# Patient Record
Sex: Male | Born: 1965 | Race: White | Hispanic: No | Marital: Married | Smoking: Never smoker
Health system: Southern US, Community
[De-identification: ages and names within clinical notes are randomized; demographics above are authoritative.]

## PROBLEM LIST (undated history)

## (undated) DIAGNOSIS — E119 Type 2 diabetes mellitus without complications: Secondary | ICD-10-CM

## (undated) DIAGNOSIS — J45909 Unspecified asthma, uncomplicated: Secondary | ICD-10-CM

## (undated) DIAGNOSIS — K219 Gastro-esophageal reflux disease without esophagitis: Secondary | ICD-10-CM

## (undated) DIAGNOSIS — Z8719 Personal history of other diseases of the digestive system: Secondary | ICD-10-CM

## (undated) DIAGNOSIS — I10 Essential (primary) hypertension: Secondary | ICD-10-CM

## (undated) DIAGNOSIS — G4730 Sleep apnea, unspecified: Secondary | ICD-10-CM

## (undated) HISTORY — PX: KNEE ARTHROSCOPY: SUR90

## (undated) HISTORY — PX: CHOLECYSTECTOMY: SHX55

## (undated) HISTORY — PX: TONSILLECTOMY: SUR1361

---
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
## 2003-11-08 ENCOUNTER — Ambulatory Visit: Payer: Self-pay | Admitting: Internal Medicine

## 2004-01-23 ENCOUNTER — Ambulatory Visit: Payer: Self-pay | Admitting: Otolaryngology

## 2004-12-02 ENCOUNTER — Ambulatory Visit: Payer: Self-pay | Admitting: Unknown Physician Specialty

## 2006-10-14 ENCOUNTER — Ambulatory Visit: Payer: Self-pay | Admitting: Unknown Physician Specialty

## 2006-10-20 ENCOUNTER — Ambulatory Visit: Payer: Self-pay | Admitting: Unknown Physician Specialty

## 2007-05-20 ENCOUNTER — Ambulatory Visit: Payer: Self-pay | Admitting: Internal Medicine

## 2007-07-26 ENCOUNTER — Encounter: Admission: RE | Admit: 2007-07-26 | Discharge: 2007-07-26 | Payer: Self-pay | Admitting: Orthopedic Surgery

## 2007-08-09 ENCOUNTER — Ambulatory Visit: Payer: Self-pay | Admitting: Internal Medicine

## 2008-01-01 ENCOUNTER — Inpatient Hospital Stay: Payer: Self-pay | Admitting: Internal Medicine

## 2009-10-21 ENCOUNTER — Ambulatory Visit: Payer: Self-pay | Admitting: Internal Medicine

## 2013-01-26 DIAGNOSIS — Z8719 Personal history of other diseases of the digestive system: Secondary | ICD-10-CM

## 2013-01-26 HISTORY — PX: ESOPHAGOGASTRODUODENOSCOPY: SHX1529

## 2013-01-26 HISTORY — PX: CHOLECYSTECTOMY, LAPAROSCOPIC: SHX56

## 2013-01-26 HISTORY — DX: Personal history of other diseases of the digestive system: Z87.19

## 2013-03-23 ENCOUNTER — Emergency Department: Payer: Self-pay | Admitting: Emergency Medicine

## 2013-03-23 LAB — CBC
HCT: 43.8 % (ref 40.0–52.0)
HGB: 15 g/dL (ref 13.0–18.0)
MCH: 30 pg (ref 26.0–34.0)
MCHC: 34.3 g/dL (ref 32.0–36.0)
MCV: 88 fL (ref 80–100)
PLATELETS: 172 10*3/uL (ref 150–440)
RBC: 5.01 10*6/uL (ref 4.40–5.90)
RDW: 13.5 % (ref 11.5–14.5)
WBC: 6.6 10*3/uL (ref 3.8–10.6)

## 2013-03-23 LAB — BASIC METABOLIC PANEL
Anion Gap: 3 — ABNORMAL LOW (ref 7–16)
BUN: 15 mg/dL (ref 7–18)
CO2: 30 mmol/L (ref 21–32)
Calcium, Total: 8.7 mg/dL (ref 8.5–10.1)
Chloride: 108 mmol/L — ABNORMAL HIGH (ref 98–107)
Creatinine: 1.04 mg/dL (ref 0.60–1.30)
GLUCOSE: 65 mg/dL (ref 65–99)
OSMOLALITY: 280 (ref 275–301)
Potassium: 3.9 mmol/L (ref 3.5–5.1)
SODIUM: 141 mmol/L (ref 136–145)

## 2013-03-23 LAB — TROPONIN I: Troponin-I: <0.02 ng/mL

## 2016-06-30 ENCOUNTER — Other Ambulatory Visit: Payer: Self-pay | Admitting: Family Medicine

## 2016-06-30 DIAGNOSIS — R748 Abnormal levels of other serum enzymes: Secondary | ICD-10-CM

## 2016-07-08 ENCOUNTER — Ambulatory Visit
Admission: RE | Admit: 2016-07-08 | Discharge: 2016-07-08 | Disposition: A | Discharge status: Home / Self Care | Payer: BC Managed Care – PPO | Source: Ambulatory Visit | Attending: Family Medicine | Admitting: Family Medicine

## 2016-07-08 DIAGNOSIS — K760 Fatty (change of) liver, not elsewhere classified: Secondary | ICD-10-CM | POA: Diagnosis not present

## 2016-07-08 DIAGNOSIS — R945 Abnormal results of liver function studies: Secondary | ICD-10-CM | POA: Insufficient documentation

## 2016-07-08 DIAGNOSIS — Z9049 Acquired absence of other specified parts of digestive tract: Secondary | ICD-10-CM | POA: Insufficient documentation

## 2016-07-08 DIAGNOSIS — R748 Abnormal levels of other serum enzymes: Secondary | ICD-10-CM

## 2017-01-26 HISTORY — PX: COLONOSCOPY: SHX174

## 2018-08-25 ENCOUNTER — Other Ambulatory Visit: Payer: Self-pay

## 2018-08-25 ENCOUNTER — Emergency Department
Admission: EM | Admit: 2018-08-25 | Discharge: 2018-08-25 | Disposition: A | Discharge status: Home / Self Care | Payer: BC Managed Care – PPO | Attending: Student in an Organized Health Care Education/Training Program | Admitting: Student in an Organized Health Care Education/Training Program

## 2018-08-25 ENCOUNTER — Emergency Department: Payer: BC Managed Care – PPO

## 2018-08-25 ENCOUNTER — Encounter: Payer: Self-pay | Admitting: Emergency Medicine

## 2018-08-25 DIAGNOSIS — R142 Eructation: Secondary | ICD-10-CM

## 2018-08-25 DIAGNOSIS — R1013 Epigastric pain: Secondary | ICD-10-CM | POA: Diagnosis not present

## 2018-08-25 DIAGNOSIS — R079 Chest pain, unspecified: Secondary | ICD-10-CM

## 2018-08-25 DIAGNOSIS — E1165 Type 2 diabetes mellitus with hyperglycemia: Secondary | ICD-10-CM | POA: Diagnosis not present

## 2018-08-25 DIAGNOSIS — R739 Hyperglycemia, unspecified: Secondary | ICD-10-CM

## 2018-08-25 DIAGNOSIS — I10 Essential (primary) hypertension: Secondary | ICD-10-CM | POA: Insufficient documentation

## 2018-08-25 HISTORY — DX: Essential (primary) hypertension: I10

## 2018-08-25 HISTORY — DX: Type 2 diabetes mellitus without complications: E11.9

## 2018-08-25 LAB — CBC
HCT: 48.7 % (ref 39.0–52.0)
Hemoglobin: 16.6 g/dL (ref 13.0–17.0)
MCH: 29.1 pg (ref 26.0–34.0)
MCHC: 34.1 g/dL (ref 30.0–36.0)
MCV: 85.3 fL (ref 80.0–100.0)
Platelets: 223 10*3/uL (ref 150–400)
RBC: 5.71 MIL/uL (ref 4.22–5.81)
RDW: 12.5 % (ref 11.5–15.5)
WBC: 10.3 10*3/uL (ref 4.0–10.5)
nRBC: 0 % (ref 0.0–0.2)

## 2018-08-25 LAB — BASIC METABOLIC PANEL
Anion gap: 9 (ref 5–15)
BUN: 25 mg/dL — ABNORMAL HIGH (ref 6–20)
CO2: 27 mmol/L (ref 22–32)
Calcium: 9.2 mg/dL (ref 8.9–10.3)
Chloride: 100 mmol/L (ref 98–111)
Creatinine, Ser: 1.15 mg/dL (ref 0.61–1.24)
GFR calc Af Amer: >60 mL/min (ref >60)
GFR calc non Af Amer: >60 mL/min (ref >60)
Glucose, Bld: 289 mg/dL — ABNORMAL HIGH (ref 70–99)
Potassium: 4.5 mmol/L (ref 3.5–5.1)
Sodium: 136 mmol/L (ref 135–145)

## 2018-08-25 LAB — HEPATIC FUNCTION PANEL
ALT: 51 U/L — ABNORMAL HIGH (ref 0–44)
AST: 26 U/L (ref 15–41)
Albumin: 4.2 g/dL (ref 3.5–5.0)
Alkaline Phosphatase: 69 U/L (ref 38–126)
Bilirubin, Direct: 0.3 mg/dL — ABNORMAL HIGH (ref 0.0–0.2)
Indirect Bilirubin: 1.1 mg/dL — ABNORMAL HIGH (ref 0.3–0.9)
Total Bilirubin: 1.4 mg/dL — ABNORMAL HIGH (ref 0.3–1.2)
Total Protein: 7.1 g/dL (ref 6.5–8.1)

## 2018-08-25 LAB — GLUCOSE, CAPILLARY
Glucose-Capillary: 267 mg/dL — ABNORMAL HIGH (ref 70–99)
Glucose-Capillary: 196 mg/dL — ABNORMAL HIGH (ref 70–99)

## 2018-08-25 LAB — TROPONIN I (HIGH SENSITIVITY)
Troponin I (High Sensitivity): 4 ng/L (ref <18)
Troponin I (High Sensitivity): 4 ng/L (ref <18)

## 2018-08-25 LAB — LIPASE, BLOOD: Lipase: 37 U/L (ref 11–51)

## 2018-08-25 MED ORDER — SODIUM CHLORIDE 0.9 % IV BOLUS
500.0000 mL | Freq: Once | INTRAVENOUS | Status: AC
  Administered 2018-08-25: 500 mL via INTRAVENOUS

## 2018-08-25 MED ORDER — INSULIN ASPART 100 UNIT/ML ~~LOC~~ SOLN
3.0000 [IU] | Freq: Once | SUBCUTANEOUS | Status: AC
  Administered 2018-08-25: 14:00:00 3 [IU] via INTRAVENOUS
  Filled 2018-08-25: qty 1

## 2018-08-25 MED ORDER — LIDOCAINE VISCOUS HCL 2 % MT SOLN
15.0000 mL | Freq: Once | OROMUCOSAL | Status: AC
  Administered 2018-08-25: 14:00:00 15 mL via ORAL
  Filled 2018-08-25: qty 15

## 2018-08-25 MED ORDER — ALUM & MAG HYDROXIDE-SIMETH 200-200-20 MG/5ML PO SUSP
30.0000 mL | Freq: Once | ORAL | Status: AC
  Administered 2018-08-25: 14:00:00 30 mL via ORAL
  Filled 2018-08-25: qty 30

## 2018-08-25 MED ORDER — PANTOPRAZOLE SODIUM 20 MG PO TBEC: 40.0000 mg | DELAYED_RELEASE_TABLET | Freq: Every day | ORAL | 0 refills | Status: DC

## 2018-08-25 NOTE — ED Notes (Signed)
Signature pad not working at time of discharge.  Pt and spouse express verbal understanding of discharge paperwork with no further questions at this time.

## 2018-08-25 NOTE — ED Triage Notes (Signed)
Pt reports pressure like cp that started yesterday and is not any better and high blood glucose levels. Pt states called his MD and was advised to come to the ED.

## 2018-08-25 NOTE — ED Provider Notes (Signed)
[facility] Emergency Department Provider Note    First MD Initiated Contact with Patient 08/25/18 1326     (approximate)  I have reviewed the triage vital signs and the nursing notes.   HISTORY  Chief Complaint Chest Pain and Hyperglycemia    HPI Dennis Whitaker is a 52 y.o. male below listed history presents to the ER for frequent belching concern for elevated blood sugars.  Patient recently had cortisone injection in his left hip.  States that his blood sugars will become elevated after steroid injections but was concerned because yesterday started having some "gassy "chest pain radiating to his left shoulder.  States he also noted several days ago that he typically has to wear readers to help with near vision but when his blood sugar was elevated he is now able to see and read things close and is having blurry vision with objects far away.  States that this has happened before and then it resolved spontaneously.  He does have some mild epigastric discomfort.  No fevers.  No nausea or vomiting.  He is moving his bowels and passing gas normally.  States he is frequently belching does have a history of reflux.  Does not smoke.     Past Medical History:  Diagnosis Date  . Diabetes mellitus without complication ([place])   . Hypertension    No family history on file. History reviewed. No pertinent surgical history. There are no active problems to display for this patient.     Prior to Admission medications   Medication Sig Start Date End Date Taking? Authorizing Provider  pantoprazole (PROTONIX) 20 MG tablet Take 2 tablets (40 mg total) by mouth daily for 14 days. 08/25/18 09/08/18  [name], MD    Allergies Patient has no allergy information on record.    Social History Social History   Tobacco Use  . Smoking status: Not on file  Substance Use Topics  . Alcohol use: Not on file  . Drug use: Not on file    Review of Systems Patient denies  headaches, rhinorrhea, blurry vision, numbness, shortness of breath, chest pain, edema, cough, abdominal pain, nausea, vomiting, diarrhea, dysuria, fevers, rashes or hallucinations unless otherwise stated above in HPI. ____________________________________________   PHYSICAL EXAM:  VITAL SIGNS: Vitals:   08/25/18 1039 08/25/18 1537  BP: (!) 131/91 126/90  Pulse: 82 69  Resp: 17 16  Temp: 99.3 F (37.4 C)   SpO2: 96% 96%    Constitutional: Alert and oriented.  Eyes: Conjunctivae are normal. No posterior hemorrhage, no hyphema or hypopion,  Optic discs drisp bilaterally.PErLA Head: Atraumatic. Nose: No congestion/rhinnorhea. Mouth/Throat: Mucous membranes are moist.   Neck: No stridor. Painless ROM.  Cardiovascular: Normal rate, regular rhythm. Grossly normal heart sounds.  Good peripheral circulation. Respiratory: Normal respiratory effort.  No retractions. Lungs CTAB. Gastrointestinal: Soft and nontender. No distention. No abdominal bruits. No CVA tenderness. Genitourinary:  Musculoskeletal: No lower extremity tenderness nor edema.  No joint effusions. Neurologic:  Normal speech and language. No gross focal neurologic deficits are appreciated. No facial droop Skin:  Skin is warm, dry and intact. No rash noted. Psychiatric: Mood and affect are normal. Speech and behavior are normal.  ____________________________________________   LABS (all labs ordered are listed, but only abnormal results are displayed)  Results for orders placed or performed during the hospital encounter of 08/25/18 (from the past 24 hour(s))  Basic metabolic panel     Status: Abnormal   Collection Time: 08/25/18 10:39  AM  Result Value Ref Range   Sodium 136 135 - 145 mmol/L   Potassium 4.5 3.5 - 5.1 mmol/L   Chloride 100 98 - 111 mmol/L   CO2 27 22 - 32 mmol/L   Glucose, Bld 289 (H) 70 - 99 mg/dL   BUN 25 (H) 6 - 20 mg/dL   Creatinine, Ser 1.15 0.61 - 1.24 mg/dL   Calcium 9.2 8.9 - 10.3 mg/dL   GFR  calc non Af Amer >60 >60 mL/min   GFR calc Af Amer >60 >60 mL/min   Anion gap 9 5 - 15  CBC     Status: None   Collection Time: 08/25/18 10:39 AM  Result Value Ref Range   WBC 10.3 4.0 - 10.5 K/uL   RBC 5.71 4.22 - 5.81 MIL/uL   Hemoglobin 16.6 13.0 - 17.0 g/dL   HCT 48.7 39.0 - 52.0 %   MCV 85.3 80.0 - 100.0 fL   MCH 29.1 26.0 - 34.0 pg   MCHC 34.1 30.0 - 36.0 g/dL   RDW 12.5 11.5 - 15.5 %   Platelets 223 150 - 400 K/uL   nRBC 0.0 0.0 - 0.2 %  Troponin I (High Sensitivity)     Status: None   Collection Time: 08/25/18 10:39 AM  Result Value Ref Range   Troponin I (High Sensitivity) 4 <18 ng/L  Glucose, capillary     Status: Abnormal   Collection Time: 08/25/18 10:47 AM  Result Value Ref Range   Glucose-Capillary 267 (H) 70 - 99 mg/dL  Troponin I (High Sensitivity)     Status: None   Collection Time: 08/25/18  2:18 PM  Result Value Ref Range   Troponin I (High Sensitivity) 4 <18 ng/L  Lipase, blood     Status: None   Collection Time: 08/25/18  2:18 PM  Result Value Ref Range   Lipase 37 11 - 51 U/L  Hepatic function panel     Status: Abnormal   Collection Time: 08/25/18  2:18 PM  Result Value Ref Range   Total Protein 7.1 6.5 - 8.1 g/dL   Albumin 4.2 3.5 - 5.0 g/dL   AST 26 15 - 41 U/L   ALT 51 (H) 0 - 44 U/L   Alkaline Phosphatase 69 38 - 126 U/L   Total Bilirubin 1.4 (H) 0.3 - 1.2 mg/dL   Bilirubin, Direct 0.3 (H) 0.0 - 0.2 mg/dL   Indirect Bilirubin 1.1 (H) 0.3 - 0.9 mg/dL  Glucose, capillary     Status: Abnormal   Collection Time: 08/25/18  3:41 PM  Result Value Ref Range   Glucose-Capillary 196 (H) 70 - 99 mg/dL   ____________________________________________  EKG My review and personal interpretation at Time: 10:38   Indication: chest pain  Rate: 75  Rhythm: sinus Axis: normal Other: normal intervals, no stemi, nonspecific t wave abn ____________________________________________  RADIOLOGY  I personally reviewed all radiographic images ordered to evaluate  for the above acute complaints and reviewed radiology reports and findings.  These findings were personally discussed with the patient.  Please see medical record for radiology report.  ____________________________________________   PROCEDURES  Procedure(s) performed:  Procedures    Critical Care performed: no ____________________________________________   INITIAL IMPRESSION / ASSESSMENT AND PLAN / ED COURSE  Pertinent labs & imaging results that were available during my care of the patient were reviewed by me and considered in my medical decision making (see chart for details).   DDX: ACS, pericarditis, esophagitis, boerhaaves, pe, dissection, pna,  bronchitis, costochondritis   KATHAN KIRKER is a 53 y.o. who presents to the ED with multiple complaints as listed above.  Patient well-appearing nontoxic.  Intact neuro exam.  Primarily concerned over his elevated blood sugar which I do think is secondary to recent steroid injection.  Is also complaining primarily of frequent belching as well as epigastric discomfort intermittently radiating to his left shoulder.  Denies any chest pain at this time.  Denies any history of CAD but based on his age and risk factors will work-up.  Patient has a heart score of 4.  Have a low suspicion clinically for ACS.  Suspect gastritis/PUD given his history.  The patient will be placed on continuous pulse oximetry and telemetry for monitoring.  Laboratory evaluation will be sent to evaluate for the above complaints.     Clinical Course as of Aug 25 1547  Thu Aug 25, 2018  1522 Patient reassessed.  Lipase normal.  LFTs normal.  Feels improved after GI cocktail.  He does not have any evidence of myocardial ischemia.  Based on heart score patient appropriate for outpatient follow-up.  Have high suspicion for reflux.  Will place on 2 weeks of Protonix and give follow-up to GI as well as cardiology.   [PR]    Clinical Course User Index [PR] [name],  MD    The patient was evaluated in Emergency Department today for the symptoms described in the history of present illness. He/she was evaluated in the context of the global COVID-19 pandemic, which necessitated consideration that the patient might be at risk for infection with the SARS-CoV-2 virus that causes COVID-19. Institutional protocols and algorithms that pertain to the evaluation of patients at risk for COVID-19 are in a state of rapid change based on information released by regulatory bodies including the CDC and federal and state organizations. These policies and algorithms were followed during the patient's care in the ED.  As part of my medical decision making, I reviewed the following data within the [place] notes reviewed and incorporated, Labs reviewed, notes from prior ED visits and [place] Controlled Substance Database   ____________________________________________   FINAL CLINICAL IMPRESSION(S) / ED DIAGNOSES  Final diagnoses:  Belching symptom  Hyperglycemia  Chest pain, unspecified type      NEW MEDICATIONS STARTED DURING THIS VISIT:  New Prescriptions   PANTOPRAZOLE (PROTONIX) 20 MG TABLET    Take 2 tablets (40 mg total) by mouth daily for 14 days.     Note:  This document was prepared using Dragon voice recognition software and may include unintentional dictation errors.    [name], MD 08/25/18 [phone]

## 2018-08-30 ENCOUNTER — Encounter: Payer: Self-pay | Admitting: Gastroenterology

## 2018-08-30 ENCOUNTER — Other Ambulatory Visit: Payer: Self-pay

## 2018-08-30 ENCOUNTER — Ambulatory Visit (INDEPENDENT_AMBULATORY_CARE_PROVIDER_SITE_OTHER): Payer: BC Managed Care – PPO | Admitting: Gastroenterology

## 2018-08-30 VITALS — BP 114/78 | HR 80 | Temp 97.0°F | Wt 269.2 lb

## 2018-08-30 DIAGNOSIS — M1991 Primary osteoarthritis, unspecified site: Secondary | ICD-10-CM | POA: Insufficient documentation

## 2018-08-30 DIAGNOSIS — M1710 Unilateral primary osteoarthritis, unspecified knee: Secondary | ICD-10-CM | POA: Insufficient documentation

## 2018-08-30 DIAGNOSIS — M179 Osteoarthritis of knee, unspecified: Secondary | ICD-10-CM | POA: Insufficient documentation

## 2018-08-30 DIAGNOSIS — K219 Gastro-esophageal reflux disease without esophagitis: Secondary | ICD-10-CM | POA: Diagnosis not present

## 2018-08-30 DIAGNOSIS — K760 Fatty (change of) liver, not elsewhere classified: Secondary | ICD-10-CM | POA: Diagnosis not present

## 2018-08-30 DIAGNOSIS — M25569 Pain in unspecified knee: Secondary | ICD-10-CM | POA: Insufficient documentation

## 2018-08-30 DIAGNOSIS — R195 Other fecal abnormalities: Secondary | ICD-10-CM

## 2018-08-30 NOTE — Progress Notes (Addendum)
Dennis Whitaker [address]  [place]  [place], [place] [address]  Main: [phone]  Fax: [phone]   Gastroenterology Consultation  Referring Provider:     [name], MD Primary Care Physician:  [name], MD Reason for Consultation:    GERD        HPI:   Chief complaint: GERD  Dennis Whitaker is a 53 y.o. y/o male referred for consultation & management  by Dr. Clemmie Krill, [name], MD.  Patient describes daily burping.  Went to the ER due to reflux episode.  States he has started taking Protonix that the ER prescribed his reflux symptoms resolved.  No nausea or vomiting.  No weight loss.  Patient is very tangential in his answers.  Reports he has a loose to soft bowel movement, and 1 day, and then will have 3 to 4 days with no bowel movements.  Then the cycle repeats.  No blood in stool.  History of colonoscopy in January 2019 for screening.  States it was normal (records obtained, colonoscopy in January 2019 with Dr. [name], for screening report scattered diverticuli in the descending colon.  Good prep.  Extent of exam, cecum.  Repeat recommended in 10 years for screening)  Had history of gallstone pancreatitis and reports an ERCP done for gallstone removal at that time.  Based on cholangiogram in 2009, this was likely all done in 2009.  Fatty liver noted in 2018 right upper quadrant ultrasound.  Patient has tried to lose weight since then.  Past Medical History:  Diagnosis Date  . Diabetes mellitus without complication ([place])   . Hypertension     No past surgical history on file.  Prior to Admission medications   Medication Sig Start Date End Date Taking? Authorizing Provider  pantoprazole (PROTONIX) 20 MG tablet Take 2 tablets (40 mg total) by mouth daily for 14 days. 08/25/18 09/08/18  [name], MD    No family history on file.   Social History   Tobacco Use  . Smoking status: Not on file  Substance Use Topics  . Alcohol use: Not on file   . Drug use: Not on file    Allergies as of 08/30/2018  . (Not on File)    Review of Systems:    All systems reviewed and negative except where noted in HPI.   Physical Exam:  There were no vitals taken for this visit. No LMP for male patient. Psych:  Alert and cooperative. Normal mood and affect. General:   Alert,  Well-developed, well-nourished, pleasant and cooperative in NAD Head:  Normocephalic and atraumatic. Eyes:  Sclera clear, no icterus.   Conjunctiva pink. Ears:  Normal auditory acuity. Nose:  No deformity, discharge, or lesions. Mouth:  No deformity or lesions,oropharynx pink & moist. Neck:  Supple; no masses or thyromegaly. Abdomen:  Normal bowel sounds.  No bruits.  Soft, non-tender and non-distended without masses, hepatosplenomegaly or hernias noted.  No guarding or rebound tenderness.    Msk:  Symmetrical without gross deformities. Good, equal movement & strength bilaterally. Pulses:  Normal pulses noted. Extremities:  No clubbing or edema.  No cyanosis. Neurologic:  Alert and oriented x3;  grossly normal neurologically. Skin:  Intact without significant lesions or rashes. No jaundice. Lymph Nodes:  No significant cervical adenopathy. Psych:  Alert and cooperative. Normal mood and affect.   Labs: CBC    Component Value Date/Time   WBC 10.3 08/25/2018 1039   RBC 5.71 08/25/2018 1039  HGB 16.6 08/25/2018 1039   HGB 15.0 03/23/2013 1217   HCT 48.7 08/25/2018 1039   HCT 43.8 03/23/2013 1217   PLT 223 08/25/2018 1039   PLT 172 03/23/2013 1217   MCV 85.3 08/25/2018 1039   MCV 88 03/23/2013 1217   MCH 29.1 08/25/2018 1039   MCHC 34.1 08/25/2018 1039   RDW 12.5 08/25/2018 1039   RDW 13.5 03/23/2013 1217   CMP     Component Value Date/Time   NA 136 08/25/2018 1039   NA 141 03/23/2013 1217   K 4.5 08/25/2018 1039   K 3.9 03/23/2013 1217   CL 100 08/25/2018 1039   CL 108 (H) 03/23/2013 1217   CO2 27 08/25/2018 1039   CO2 30 03/23/2013 1217   GLUCOSE  289 (H) 08/25/2018 1039   GLUCOSE 65 03/23/2013 1217   BUN 25 (H) 08/25/2018 1039   BUN 15 03/23/2013 1217   CREATININE 1.15 08/25/2018 1039   CREATININE 1.04 03/23/2013 1217   CALCIUM 9.2 08/25/2018 1039   CALCIUM 8.7 03/23/2013 1217   PROT 7.1 08/25/2018 1418   ALBUMIN 4.2 08/25/2018 1418   AST 26 08/25/2018 1418   ALT 51 (H) 08/25/2018 1418   ALKPHOS 69 08/25/2018 1418   BILITOT 1.4 (H) 08/25/2018 1418   GFRNONAA >60 08/25/2018 1039   GFRNONAA >60 03/23/2013 1217   GFRAA >60 08/25/2018 1039   GFRAA >60 03/23/2013 1217    Imaging Studies: Dg Chest 2 View  Result Date: 08/25/2018 CLINICAL DATA:  Chest pain since yesterday. EXAM: CHEST - 2 VIEW COMPARISON:  None. FINDINGS: The cardiac silhouette, mediastinal and hilar contours are within normal limits and stable. Stable mild eventration of the right hemidiaphragm with overlying vascular crowding. No infiltrates, edema or effusions. The bony thorax is intact. IMPRESSION: No acute cardiopulmonary findings. Electronically Signed   By: Marijo Sanes M.D.   On: 08/25/2018 12:22   Dg Abd 2 Views  Result Date: 08/25/2018 CLINICAL DATA:  Chest pain. Belching. EXAM: ABDOMEN - 2 VIEW COMPARISON:  None. FINDINGS: Cholecystectomy. Bowel gas pattern is normal. No free air. No acute bone abnormality. Arthritic changes of both hips, left worse than right. IMPRESSION: No acute abnormalities. Electronically Signed   By: Lorriane Shire M.D.   On: 08/25/2018 14:17    Assessment and Plan:   [name] is a 53 y.o. y/o male has been referred for GERD  Signs and symptoms consistent with GERD  Patient also educated about avoiding behavioral things that can cause aerophagia, such as avoiding swallowing with a straw, avoiding swallowing area inadvertently, avoiding sodas or carbonated drinks.  Patient educated extensively on acid reflux lifestyle modification, including buying a bed wedge, not eating 3 hrs before bedtime, diet modifications, and  handout given for the same.   Patient also reports dysphagia, only to potato based foods such as [place] fries.  He states it feels that they get stuck in his mid chest and eventually pass.  We discussed EGD due to this dysphagia and he is willing to proceed  We will try to obtain his colonoscopy report from January 2019 at [facility]  I have discussed alternative options, risks & benefits,  which include, but are not limited to, bleeding, infection, perforation,respiratory complication & drug reaction.  The patient agrees with this plan & written consent will be obtained.    In regard to his bowel movements, I have asked him to start taking Metamucil daily to help regulate his bowel movements better  Finding of fatty  liver on imaging discussed with patient Diet, weight loss, and exercise encouraged along with avoiding hepatotoxic drugs including alcohol Risk of progression to cirrhosis if above measures are not instituted were discussed as well, and patient verbalized understanding  We will also rule out viral hepatitis and check for immunity to hep A and B  Dr Dennis Whitaker  Speech recognition software was used to dictate the above note.

## 2018-08-30 NOTE — Progress Notes (Signed)
Pt to call back and schedule EGD for GERD, in [place]. He has to check with co-worker to find out what day he is able too.

## 2018-08-30 NOTE — Patient Instructions (Addendum)
Gastroesophageal Reflux Disease, Adult Gastroesophageal reflux (GER) happens when acid from the stomach flows up into the tube that connects the mouth and the stomach (esophagus). Normally, food travels down the esophagus and stays in the stomach to be digested. With GER, food and stomach acid sometimes move back up into the esophagus. You may have a disease called gastroesophageal reflux disease (GERD) if the reflux:  Happens often.  Causes frequent or very bad symptoms.  Causes problems such as damage to the esophagus. When this happens, the esophagus becomes sore and swollen (inflamed). Over time, GERD can make small holes (ulcers) in the lining of the esophagus. What are the causes? This condition is caused by a problem with the muscle between the esophagus and the stomach. When this muscle is weak or not normal, it does not close properly to keep food and acid from coming back up from the stomach. The muscle can be weak because of:  Tobacco use.  Pregnancy.  Having a certain type of hernia (hiatal hernia).  Alcohol use.  Certain foods and drinks, such as coffee, chocolate, onions, and peppermint. What increases the risk? You are more likely to develop this condition if you:  Are overweight.  Have a disease that affects your connective tissue.  Use NSAID medicines. What are the signs or symptoms? Symptoms of this condition include:  Heartburn.  Difficult or painful swallowing.  The feeling of having a lump in the throat.  A bitter taste in the mouth.  Bad breath.  Having a lot of saliva.  Having an upset or bloated stomach.  Belching.  Chest pain. Different conditions can cause chest pain. Make sure you see your doctor if you have chest pain.  Shortness of breath or noisy breathing (wheezing).  Ongoing (chronic) cough or a cough at night.  Wearing away of the surface of teeth (tooth enamel).  Weight loss. How is this treated? Treatment will depend on how  bad your symptoms are. Your doctor may suggest:  Changes to your diet.  Medicine.  Surgery. Follow these instructions at home: Eating and drinking   Follow a diet as told by your doctor. You may need to avoid foods and drinks such as: ? Coffee and tea (with or without caffeine). ? Drinks that contain alcohol. ? Energy drinks and sports drinks. ? Bubbly (carbonated) drinks or sodas. ? Chocolate and cocoa. ? Peppermint and mint flavorings. ? Garlic and onions. ? Horseradish. ? Spicy and acidic foods. These include peppers, chili powder, curry powder, vinegar, hot sauces, and BBQ sauce. ? Citrus fruit juices and citrus fruits, such as oranges, lemons, and limes. ? Tomato-based foods. These include red sauce, chili, salsa, and pizza with red sauce. ? Fried and fatty foods. These include donuts, french fries, potato chips, and high-fat dressings. ? High-fat meats. These include hot dogs, rib eye steak, sausage, ham, and bacon. ? High-fat dairy items, such as whole milk, butter, and cream cheese.  Eat small meals often. Avoid eating large meals.  Avoid drinking large amounts of liquid with your meals.  Avoid eating meals during the 2-3 hours before bedtime.  Avoid lying down right after you eat.  Do not exercise right after you eat. Lifestyle   Do not use any products that contain nicotine or tobacco. These include cigarettes, e-cigarettes, and chewing tobacco. If you need help quitting, ask your doctor.  Try to lower your stress. If you need help doing this, ask your doctor.  If you are overweight, lose an amount   of weight that is healthy for you. Ask your doctor about a safe weight loss goal. General instructions  Pay attention to any changes in your symptoms.  Take over-the-counter and prescription medicines only as told by your doctor. Do not take aspirin, ibuprofen, or other NSAIDs unless your doctor says it is okay.  Wear loose clothes. Do not wear anything tight  around your waist.  Raise (elevate) the head of your bed about 6 inches (15 cm).  Avoid bending over if this makes your symptoms worse.  Keep all follow-up visits as told by your doctor. This is important. Contact a doctor if:  You have new symptoms.  You lose weight and you do not know why.  You have trouble swallowing or it hurts to swallow.  You have wheezing or a cough that keeps happening.  Your symptoms do not get better with treatment.  You have a hoarse voice. Get help right away if:  You have pain in your arms, neck, jaw, teeth, or back.  You feel sweaty, dizzy, or light-headed.  You have chest pain or shortness of breath.  You throw up (vomit) and your throw-up looks like blood or coffee grounds.  You pass out (faint).  Your poop (stool) is bloody or black.  You cannot swallow, drink, or eat. Summary  If a person has gastroesophageal reflux disease (GERD), food and stomach acid move back up into the esophagus and cause symptoms or problems such as damage to the esophagus.  Treatment will depend on how bad your symptoms are.  Follow a diet as told by your doctor.  Take all medicines only as told by your doctor. This information is not intended to replace advice given to you by your health care provider. Make sure you discuss any questions you have with your health care provider. Document Released: 07/01/2007 Document Revised: 07/21/2017 Document Reviewed: 07/21/2017 Elsevier Patient Education  2020 [place].   Fatty Liver Disease  Fatty liver disease occurs when too much fat has built up in your liver cells. Fatty liver disease is also called hepatic steatosis or steatohepatitis. The liver removes harmful substances from your bloodstream and produces fluids that your body needs. It also helps your body use and store energy from the food you eat. In many cases, fatty liver disease does not cause symptoms or problems. It is often diagnosed when tests  are being done for other reasons. However, over time, fatty liver can cause inflammation that may lead to more serious liver problems, such as scarring of the liver (cirrhosis) and liver failure. Fatty liver is associated with insulin resistance, increased body fat, high blood pressure (hypertension), and high cholesterol. These are features of metabolic syndrome and increase your risk for stroke, diabetes, and heart disease. What are the causes? This condition may be caused by:  Drinking too much alcohol.  Poor nutrition.  Obesity.  Cushing's syndrome.  Diabetes.  High cholesterol.  Certain drugs.  Poisons.  Some viral infections.  Pregnancy. What increases the risk? You are more likely to develop this condition if you:  Abuse alcohol.  Are overweight.  Have diabetes.  Have hepatitis.  Have a high triglyceride level.  Are pregnant. What are the signs or symptoms? Fatty liver disease often does not cause symptoms. If symptoms do develop, they can include:  Fatigue.  Weakness.  Weight loss.  Confusion.  Abdominal pain.  Nausea and vomiting.  Yellowing of your skin and the white parts of your eyes (jaundice).  Itchy skin.  How is this diagnosed? This condition may be diagnosed by:  A physical exam and medical history.  Blood tests.  Imaging tests, such as an ultrasound, CT scan, or MRI.  A liver biopsy. A small sample of liver tissue is removed using a needle. The sample is then looked at under a microscope. How is this treated? Fatty liver disease is often caused by other health conditions. Treatment for fatty liver may involve medicines and lifestyle changes to manage conditions such as:  Alcoholism.  High cholesterol.  Diabetes.  Being overweight or obese. Follow these instructions at home:   Do not drink alcohol. If you have trouble quitting, ask your health care provider how to safely quit with the help of medicine or a supervised  program. This is important to keep your condition from getting worse.  Eat a healthy diet as told by your health care provider. Ask your health care provider about working with a diet and nutrition specialist (dietitian) to develop an eating plan.  Exercise regularly. This can help you lose weight and control your cholesterol and diabetes. Talk to your health care provider about an exercise plan and which activities are best for you.  Take over-the-counter and prescription medicines only as told by your health care provider.  Keep all follow-up visits as told by your health care provider. This is important. Contact a health care provider if: You have trouble controlling your:  Blood sugar. This is especially important if you have diabetes.  Cholesterol.  Drinking of alcohol. Get help right away if:  You have abdominal pain.  You have jaundice.  You have nausea and vomiting.  You vomit blood or material that looks like coffee grounds.  You have stools that are black, tar-like, or bloody. Summary  Fatty liver disease develops when too much fat builds up in the cells of your liver.  Fatty liver disease often causes no symptoms or problems. However, over time, fatty liver can cause inflammation that may lead to more serious liver problems, such as scarring of the liver (cirrhosis).  You are more likely to develop this condition if you abuse alcohol, are pregnant, are overweight, have diabetes, have hepatitis, or have high triglyceride levels.  Contact your health care provider if you have trouble controlling your weight, blood sugar, cholesterol, or drinking of alcohol. This information is not intended to replace advice given to you by your health care provider. Make sure you discuss any questions you have with your health care provider. Document Released: 02/27/2005 Document Revised: 12/25/2016 Document Reviewed: 10/21/2016 Elsevier Patient Education  2020 [place] for Gastroesophageal Reflux Disease, Adult When you have gastroesophageal reflux disease (GERD), the foods you eat and your eating habits are very important. Choosing the right foods can help ease your discomfort. Think about working with a nutrition specialist (dietitian) to help you make good choices. What are tips for following this plan?  Meals  Choose healthy foods that are low in fat, such as fruits, vegetables, whole grains, low-fat dairy products, and lean meat, fish, and poultry.  Eat small meals often instead of 3 large meals a day. Eat your meals slowly, and in a place where you are relaxed. Avoid bending over or lying down until 2-3 hours after eating.  Avoid eating meals 2-3 hours before bed.  Avoid drinking a lot of liquid with meals.  Cook foods using methods other than frying. Bake, grill, or broil food instead.  Avoid or limit: ? Chocolate. ? Peppermint  or spearmint. ? Alcohol. ? Pepper. ? Black and decaffeinated coffee. ? Black and decaffeinated tea. ? Bubbly (carbonated) soft drinks. ? Caffeinated energy drinks and soft drinks.  Limit high-fat foods such as: ? Fatty meat or fried foods. ? Whole milk, cream, butter, or ice cream. ? Nuts and nut butters. ? Pastries, donuts, and sweets made with butter or shortening.  Avoid foods that cause symptoms. These foods may be different for everyone. Common foods that cause symptoms include: ? Tomatoes. ? Oranges, lemons, and limes. ? Peppers. ? Spicy food. ? Onions and garlic. ? Vinegar. Lifestyle  Maintain a healthy weight. Ask your doctor what weight is healthy for you. If you need to lose weight, work with your doctor to do so safely.  Exercise for at least 30 minutes for 5 or more days each week, or as told by your doctor.  Wear loose-fitting clothes.  Do not smoke. If you need help quitting, ask your doctor.  Sleep with the head of your bed higher than your feet. Use a wedge under the mattress or  blocks under the bed frame to raise the head of the bed. Summary  When you have gastroesophageal reflux disease (GERD), food and lifestyle choices are very important in easing your symptoms.  Eat small meals often instead of 3 large meals a day. Eat your meals slowly, and in a place where you are relaxed.  Limit high-fat foods such as fatty meat or fried foods.  Avoid bending over or lying down until 2-3 hours after eating.  Avoid peppermint and spearmint, caffeine, alcohol, and chocolate. This information is not intended to replace advice given to you by your health care provider. Make sure you discuss any questions you have with your health care provider. Document Released: 07/14/2011 Document Revised: 05/05/2018 Document Reviewed: 02/18/2016 Elsevier Patient Education  [place] [address] (OTHER THE COUNTER)

## 2018-08-31 LAB — HEPATITIS A ANTIBODY, TOTAL: hep A Total Ab: NEGATIVE

## 2018-08-31 LAB — HEPATITIS C ANTIBODY (REFLEX): HCV Ab: <0.1 s/co ratio (ref 0.0–0.9)

## 2018-08-31 LAB — HCV COMMENT:

## 2018-08-31 LAB — HEPATITIS B CORE ANTIBODY, TOTAL: Hep B Core Total Ab: NEGATIVE

## 2018-08-31 LAB — HEPATITIS B SURFACE ANTIGEN: Hepatitis B Surface Ag: NEGATIVE

## 2018-08-31 LAB — HEPATITIS B SURFACE ANTIBODY,QUALITATIVE: Hep B Surface Ab, Qual: NONREACTIVE

## 2018-09-19 ENCOUNTER — Telehealth: Payer: Self-pay

## 2018-09-19 NOTE — Telephone Encounter (Signed)
-----   Message from Virgel Manifold, MD sent at 09/13/2018  1:37 PM EDT ----- Dennis Whitaker please let patient know, his blood work did not show any evidence of viral hepatitis.  I recommend vaccination with hepatitis A and B.  He can schedule this with our clinic.

## 2018-09-19 NOTE — Telephone Encounter (Signed)
Pt notified of lab work not showing any viral hepatitis and needs HEP A/B vaccination. Pt in agreement and I will have front desk call pt to make this appt.

## 2018-10-10 ENCOUNTER — Ambulatory Visit (INDEPENDENT_AMBULATORY_CARE_PROVIDER_SITE_OTHER): Payer: BC Managed Care – PPO | Admitting: Gastroenterology

## 2018-10-10 ENCOUNTER — Other Ambulatory Visit: Payer: Self-pay

## 2018-10-10 DIAGNOSIS — K760 Fatty (change of) liver, not elsewhere classified: Secondary | ICD-10-CM

## 2018-10-10 DIAGNOSIS — Z23 Encounter for immunization: Secondary | ICD-10-CM | POA: Diagnosis not present

## 2018-11-11 ENCOUNTER — Other Ambulatory Visit (INDEPENDENT_AMBULATORY_CARE_PROVIDER_SITE_OTHER): Payer: BC Managed Care – PPO

## 2018-11-11 ENCOUNTER — Other Ambulatory Visit: Payer: Self-pay

## 2018-11-11 ENCOUNTER — Ambulatory Visit (INDEPENDENT_AMBULATORY_CARE_PROVIDER_SITE_OTHER): Payer: BC Managed Care – PPO | Admitting: Gastroenterology

## 2018-11-11 DIAGNOSIS — Z23 Encounter for immunization: Secondary | ICD-10-CM | POA: Diagnosis not present

## 2019-01-18 ENCOUNTER — Ambulatory Visit: Payer: BC Managed Care – PPO | Attending: Internal Medicine

## 2019-01-18 DIAGNOSIS — Z20822 Contact with and (suspected) exposure to covid-19: Secondary | ICD-10-CM

## 2019-01-20 LAB — NOVEL CORONAVIRUS, NAA: SARS-CoV-2, NAA: NOT DETECTED

## 2019-02-14 ENCOUNTER — Telehealth: Payer: Self-pay

## 2019-02-14 NOTE — Telephone Encounter (Signed)
Pt called to request advice regarding reflux symptoms. Pt states recently his acid reflux has become worse. Pt states he's experiencing a "quivering" or "shaking" sensation in his esophagus and stomach and then begins to sweat shortly after. Pt states it's worse after eating. Pt states he's currently taking Prevacid (Lansoprazole). He doesn't do well with omeprazole. Pt wants to know if there are any other over-the-counter medications he could try or if he could take the prevacid twice daily instead of once. I explained that I'll relay this information to Dr. Vicente Males for advice.

## 2019-02-15 ENCOUNTER — Other Ambulatory Visit: Payer: Self-pay

## 2019-02-15 MED ORDER — SUCRALFATE 1 G PO TABS: 1.0000 g | ORAL_TABLET | Freq: Three times a day (TID) | ORAL | 2 refills | Status: DC

## 2019-02-15 NOTE — Telephone Encounter (Signed)
Spoke with pt and informed him of Dr. Georgeann Oppenheim recommendations. Pt agrees to try the carafate and wants to discuss the EGD with Dr. Vicente Males during upcoming visit before scheduling. Carafate prescription has been sent to pt's preferred pharmacy.

## 2019-02-15 NOTE — Telephone Encounter (Signed)
Take prevacid daily  Add carafate QID Needs EGD

## 2019-02-22 ENCOUNTER — Ambulatory Visit: Payer: BC Managed Care – PPO | Admitting: Gastroenterology

## 2019-02-22 ENCOUNTER — Other Ambulatory Visit: Payer: Self-pay

## 2019-02-22 VITALS — BP 145/90 | HR 75 | Temp 97.7°F | Ht 72.0 in | Wt 280.8 lb

## 2019-02-22 DIAGNOSIS — R142 Eructation: Secondary | ICD-10-CM

## 2019-02-22 DIAGNOSIS — R1310 Dysphagia, unspecified: Secondary | ICD-10-CM | POA: Diagnosis not present

## 2019-02-22 DIAGNOSIS — K219 Gastro-esophageal reflux disease without esophagitis: Secondary | ICD-10-CM

## 2019-02-22 NOTE — Progress Notes (Signed)
Jonathon Bellows MD, MRCP(U.K) [address]  [place]  [place], [place] [address]  Main: [phone]  Fax: [phone]   Primary Care Physician: [name], MD  Primary Gastroenterologist:  Dr. Jonathon Bellows   Has acid reflux and here for follow-up  HPI: Dennis Whitaker is a 54 y.o. male is a patient who sees Dr. Bonna Gains who is away on vacation.  Last seen in the office on 08/30/2018.  As per her last note reflux resolved with Protonix.  At that point of time he did have some issues with dysphagia and an EGD was discussed but he did not proceed with the same.  He called the office back on 02/14/2019 and mentioned that his acid reflux was worse and he is here today to discuss with me further  He states that he has had issues with swallowing certain foods such as [place] fries and potatoes.  Get stuck in the middle of his chest.  He says he has had issues with gas and bloating sensation of feeling full in the stomach.  This is usually worse when he has elevated blood sugars.  Recently he has been started on opioids for his knee and hip pain which has made the symptoms more aggravated.  He is diabetic and his blood sugars per his history are usually in the ranges of 170 -140.  He takes his Metformin daily but takes his glipizide only as needed.  Current Outpatient Medications  Medication Sig Dispense Refill  . lisinopril (ZESTRIL) 40 MG tablet     . meloxicam (MOBIC) 15 MG tablet meloxicam 15 mg tablet    . metFORMIN (GLUCOPHAGE) 1000 MG tablet     . pantoprazole (PROTONIX) 20 MG tablet Take 2 tablets (40 mg total) by mouth daily for 14 days. 28 tablet 0  . sucralfate (CARAFATE) 1 g tablet Take 1 tablet (1 g total) by mouth 4 (four) times daily -  with meals and at bedtime. 120 tablet 2   No current facility-administered medications for this visit.    Allergies as of 02/22/2019  . (No Known Allergies)    ROS:  General: Negative for anorexia, weight loss, fever, chills, fatigue,  weakness. ENT: Negative for hoarseness, difficulty swallowing , nasal congestion. CV: Negative for chest pain, angina, palpitations, dyspnea on exertion, peripheral edema.  Respiratory: Negative for dyspnea at rest, dyspnea on exertion, cough, sputum, wheezing.  GI: See history of present illness. GU:  Negative for dysuria, hematuria, urinary incontinence, urinary frequency, nocturnal urination.  Endo: Negative for unusual weight change.    Physical Examination:   There were no vitals taken for this visit.  General: Well-nourished, well-developed in no acute distress.  Eyes: No icterus. Conjunctivae pink. Mouth: Oropharyngeal mucosa moist and pink , no lesions erythema or exudate. Lungs: Clear to auscultation bilaterally. Non-labored. Heart: Regular rate and rhythm, no murmurs rubs or gallops.  Abdomen: Bowel sounds are normal, nontender, nondistended, no hepatosplenomegaly or masses, no abdominal bruits or hernia , no rebound or guarding.   Extremities: No lower extremity edema. No clubbing or deformities. Neuro: Alert and oriented x 3.  Grossly intact. Skin: Warm and dry, no jaundice.   Psych: Alert and cooperative, normal mood and affect.   Imaging Studies: No results found.  Assessment and Plan:   Dennis Whitaker is a 54 y.o. y/o male here to see me for an urgent visit as his doctor Dr. Bonna Gains is away.  His main issues are acid reflux which is seems  to be well controlled on Dexilant which I started a week back.  He does have some issues with swallowing with certain foods getting stuck in the middle of his chest.  In addition bloating abdominal distention usually worse when he has high blood sugars and since he commenced on opioids for his knee and hip pain.  These features are suggestive of possible gastroparesis due to elevated blood sugars as well as effects of opioids.  I discussed in depth with him the need for an upper endoscopy to rule out gastric outlet obstruction,  esophageal stricture and possible dilation.  In addition talked about avoiding fatty foods, small meals more often, low fiber diet.  I will provide him information on a gastroparesis diet.  Eventually he probably will need a gastric emptying study.  I talked to him about tight glycemic control and the need for him to take his medications on a regular basis rather than as needed.  He would need tighter blood sugar control in general less than 150 mg/dL would be ideal   I have discussed alternative options, risks & benefits,  which include, but are not limited to, bleeding, infection, perforation,respiratory complication & drug reaction.  The patient agrees with this plan & written consent will be obtained.   Dr Jonathon Bellows  MD,MRCP [facility]) Follow up in 2 to 3 months with Dr. Bonna Gains

## 2019-02-22 NOTE — Patient Instructions (Signed)
Gastroparesis  Gastroparesis is a condition in which food takes longer than normal to empty from the stomach. The condition is usually long-lasting (chronic). It may also be called delayed gastric emptying. There is no cure, but there are treatments and things that you can do at home to help relieve symptoms. Treating the underlying condition that causes gastroparesis can also help relieve symptoms. What are the causes? In many cases, the cause of this condition is not known. Possible causes include:  A hormone (endocrine) disorder, such as hypothyroidism or diabetes.  A nervous system disease, such as Parkinson's disease or multiple sclerosis.  Cancer, infection, or surgery that affects the stomach or vagus nerve. The vagus nerve runs from your chest, through your neck, to the lower part of your brain.  A connective tissue disorder, such as scleroderma.  Certain medicines. What increases the risk? You are more likely to develop this condition if you:  Have certain disorders or diseases, including: ? An endocrine disorder. ? An eating disorder. ? Amyloidosis. ? Scleroderma. ? Parkinson's disease. ? Multiple sclerosis. ? Cancer or infection of the stomach or the vagus nerve.  Have had surgery on the stomach or vagus nerve.  Take certain medicines.  Are male. What are the signs or symptoms? Symptoms of this condition include:  Feeling full after eating very little.  Nausea.  Vomiting.  Heartburn.  Abdominal bloating.  Inconsistent blood sugar (glucose) levels on blood tests.  Lack of appetite.  Weight loss.  Acid from the stomach coming up into the esophagus (gastroesophageal reflux).  Sudden tightening (spasm) of the stomach, which can be painful. Symptoms may come and go. Some people may not notice any symptoms. How is this diagnosed? This condition is diagnosed with tests, such as:  Tests that check how long it takes food to move through the stomach and  intestines. These tests include: ? Upper gastrointestinal (GI) series. For this test, you drink a liquid that shows up well on X-rays, and then X-rays will be taken of your intestines. ? Gastric emptying scintigraphy. For this test, you eat food that contains a small amount of radioactive material, and then scans are taken. ? Wireless capsule GI monitoring system. For this test, you swallow a pill (capsule) that records information about how foods and fluid move through your stomach.  Gastric manometry. For this test, a tube is passed down your throat and into your stomach to measure electrical and muscular activity.  Endoscopy. For this test, a long, thin tube is passed down your throat and into your stomach to check for problems in your stomach lining.  Ultrasound. This test uses sound waves to create images of inside the body. This can help rule out gallbladder disease or pancreatitis as a cause of your symptoms. How is this treated? There is no cure for gastroparesis. Treatment may include:  Treating the underlying cause.  Managing your symptoms by making changes to your diet and exercise habits.  Taking medicines to control nausea and vomiting and to stimulate stomach muscles.  Getting food through a feeding tube in the hospital. This may be done in severe cases.  Having surgery to insert a device into your body that helps improve stomach emptying and control nausea and vomiting (gastric neurostimulator). Follow these instructions at home:  Take over-the-counter and prescription medicines only as told by your health care provider.  Follow instructions from your health care provider about eating or drinking restrictions. Your health care provider may recommend that you: ? Eat   smaller meals more often. ? Eat low-fat foods. ? Eat low-fiber forms of high-fiber foods. For example, eat cooked vegetables instead of raw vegetables. ? Have only liquid foods instead of solid foods. Liquid  foods are easier to digest.  Drink enough fluid to keep your urine pale yellow.  Exercise as often as told by your health care provider.  Keep all follow-up visits as told by your health care provider. This is important. Contact a health care provider if you:  Notice that your symptoms do not improve with treatment.  Have new symptoms. Get help right away if you:  Have severe abdominal pain that does not improve with treatment.  Have nausea that is severe or does not go away.  Cannot drink fluids without vomiting. Summary  Gastroparesis is a chronic condition in which food takes longer than normal to empty from the stomach.  Symptoms include nausea, vomiting, heartburn, abdominal bloating, and loss of appetite.  Eating smaller portions, and low-fat, low-fiber foods may help you manage your symptoms.  Get help right away if you have severe abdominal pain. This information is not intended to replace advice given to you by your health care provider. Make sure you discuss any questions you have with your health care provider. Document Revised: 04/12/2017 Document Reviewed: 11/17/2016 Elsevier Patient Education  2020 [employer]  

## 2019-03-06 ENCOUNTER — Encounter: Payer: BC Managed Care – PPO | Attending: Family Medicine | Admitting: Dietician

## 2019-03-06 ENCOUNTER — Ambulatory Visit: Payer: BC Managed Care – PPO | Admitting: Gastroenterology

## 2019-03-06 ENCOUNTER — Encounter: Payer: Self-pay | Admitting: Dietician

## 2019-03-06 ENCOUNTER — Other Ambulatory Visit: Payer: Self-pay

## 2019-03-06 VITALS — Ht 72.0 in | Wt 279.4 lb

## 2019-03-06 DIAGNOSIS — E119 Type 2 diabetes mellitus without complications: Secondary | ICD-10-CM | POA: Diagnosis not present

## 2019-03-06 DIAGNOSIS — Z6837 Body mass index (BMI) 37.0-37.9, adult: Secondary | ICD-10-CM

## 2019-03-06 NOTE — Progress Notes (Signed)
Medical Nutrition Therapy: Visit start time: [id number]  end time: 1310  Assessment:  Diagnosis: Type 2 DM, obesity Past medical history: HTN, GERD Psychosocial issues/ stress concerns: none  Preferred learning method:  . Auditory  Current weight: 279.4lbs with shoes  Height: 6'0" Medications, supplements: reconciled list in medical record; has also been taking glipizide (left from old prescription) when his fasting BG is >130.  Progress and evaluation:   Patient reports several year history of diabetes; BGs were high recently, following 2 cortisone injections within 3 months. He was also drinking sweet tea and some regular sodas.  He has stopped beverages with sugar, and has significantly reduced intake of carbohydrate foods, often eliminating starches from meals.   He is being tested for GI distress, GERD symptoms and increased bloating and gas; he states possible gastroparesis. He will be having an endoscopy 03/17/19.   He feels he has not done well in caring for his diabetes in recent months, with less healthy eating habits and decline in physical activity due to hip and knee pain.   Patient and wife attended diabetes management classes at this location several years ago. He has kept the materials provided in the classes.   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 Activity is limited due to hip and knee pain.  Dietary Intake:  Usual eating pattern includes 3 meals and 1-2 snacks per day. Dining out frequency: unknown  Breakfast:  Microwave bowl with veg, potatoes,meat and cheese, and adds 2 eggs; egg salad; boiled eggs, banana or dried cranberries or plums when driving to work;  sometimes skips if BG is high Snack: cheese stick, meat stick Lunch: canned soup, no crackers or bread Snack: same as am Supper: taco soup (with beans, corn, meat); cabbage stew/ one-pot meal; chicken grilled thighs with cabbage, small portion mashed potatoes;  Snack: blueberries with light  whipped topping; trail mix (spicy/ savory) Beverages: water about 70oz daily, Mt Dew zero with meal or sweet tea with meal  Nutrition Care Education: Topics covered:  Basic nutrition: basic food groups, appropriate nutrient balance, appropriate meal and snack schedule, general nutrition guidelines    Weight control: importance of low sugar and low fat choices, portion control strategies including using small plates, increasing low-carb vegetable portions, estimated energy needs for weight loss at 1700kcal, provided guidance for 45% CHO, 25% protein, and 30% fat Advanced nutrition: food label reading for carbohydrate Diabetes:  goals for BGs, appropriate meal and snack schedule, appropriate carb intake and balance, healthy carb choices, role of fiber, protein, fat   Nutritional Diagnosis:  [place]-2.2 Altered nutrition-related laboratory As related to Type 2 diabetes.  As evidenced by recent elevated BG results. [place]-3.3 Overweight/obesity As related to excess calories and inadequate physical activity.  As evidenced by patient with current BMI of 37.9, working on diet and lifestyle changes to promote weight loss and improve BG control.  Intervention:   Instruction and discussion as noted above.  Patient has begun working on positive changes; encouraged moderation and balance with carb intake vs all-or-nothing mindset  Established goals for healthy nutritional balance in meals and portions and choices to aid in further improvement of GI symptoms.   Education Materials given:  . Plate Planner with food lists, sample meal pattern . Combination foods, fast foods lists . sample menus . Snacking handout . Goals/ instructions   Learner/ who was taught:  . Patient  . Spouse/ partner   Level of understanding: Marland Kitchen Verbalizes/ demonstrates competency   Demonstrated degree  of understanding via:   Teach back Learning barriers: . None  Willingness to learn/ readiness for change: . Eager, change  in progress  Monitoring and Evaluation:  Dietary intake, exercise, BG control, and body weight      follow up: 04/18/19 at 9:00am

## 2019-03-06 NOTE — Patient Instructions (Signed)
   Include 3 servings (45grams) of carbohydrate with each meal. Avoid very high fiber foods like bran, raw vegetables, fruits with thick peels and seeds, other nuts or seeds, popcorn until further GI tests are done. If you eat cereal, try special K or cheerios that have whole grain but not very   Continue to reduce any drinks with sugar. OK to use some splenda, stevia or sweet n low but limit sugar alcohols as they can produce more gas/ bloating.   Avoid large meals and large portions to help with digestion.

## 2019-03-10 ENCOUNTER — Ambulatory Visit: Payer: BC Managed Care – PPO | Admitting: Dietician

## 2019-03-15 ENCOUNTER — Other Ambulatory Visit
Admission: RE | Admit: 2019-03-15 | Discharge: 2019-03-15 | Disposition: A | Discharge status: Home / Self Care | Payer: BC Managed Care – PPO | Source: Ambulatory Visit | Attending: Gastroenterology | Admitting: Gastroenterology

## 2019-03-15 ENCOUNTER — Other Ambulatory Visit: Payer: Self-pay

## 2019-03-15 DIAGNOSIS — Z20822 Contact with and (suspected) exposure to covid-19: Secondary | ICD-10-CM | POA: Diagnosis not present

## 2019-03-15 DIAGNOSIS — Z01812 Encounter for preprocedural laboratory examination: Secondary | ICD-10-CM | POA: Diagnosis not present

## 2019-03-15 LAB — SARS CORONAVIRUS 2 (TAT 6-24 HRS): SARS Coronavirus 2: NEGATIVE

## 2019-03-17 ENCOUNTER — Ambulatory Visit: Payer: BC Managed Care – PPO | Admitting: Anesthesiology

## 2019-03-17 ENCOUNTER — Encounter: Payer: Self-pay | Admitting: Gastroenterology

## 2019-03-17 ENCOUNTER — Other Ambulatory Visit: Payer: Self-pay

## 2019-03-17 ENCOUNTER — Ambulatory Visit
Admission: RE | Admit: 2019-03-17 | Discharge: 2019-03-17 | Disposition: A | Discharge status: Home / Self Care | Payer: BC Managed Care – PPO | Attending: Gastroenterology | Admitting: Gastroenterology

## 2019-03-17 ENCOUNTER — Encounter
Admission: RE | Disposition: A | Discharge status: Home / Self Care | Payer: Self-pay | Source: Home / Self Care | Attending: Gastroenterology

## 2019-03-17 DIAGNOSIS — Z7982 Long term (current) use of aspirin: Secondary | ICD-10-CM | POA: Insufficient documentation

## 2019-03-17 DIAGNOSIS — Z79899 Other long term (current) drug therapy: Secondary | ICD-10-CM | POA: Diagnosis not present

## 2019-03-17 DIAGNOSIS — Z791 Long term (current) use of non-steroidal anti-inflammatories (NSAID): Secondary | ICD-10-CM | POA: Insufficient documentation

## 2019-03-17 DIAGNOSIS — E119 Type 2 diabetes mellitus without complications: Secondary | ICD-10-CM | POA: Insufficient documentation

## 2019-03-17 DIAGNOSIS — K219 Gastro-esophageal reflux disease without esophagitis: Secondary | ICD-10-CM

## 2019-03-17 DIAGNOSIS — Z79891 Long term (current) use of opiate analgesic: Secondary | ICD-10-CM | POA: Insufficient documentation

## 2019-03-17 DIAGNOSIS — I10 Essential (primary) hypertension: Secondary | ICD-10-CM | POA: Insufficient documentation

## 2019-03-17 DIAGNOSIS — R1310 Dysphagia, unspecified: Secondary | ICD-10-CM | POA: Insufficient documentation

## 2019-03-17 DIAGNOSIS — Z7984 Long term (current) use of oral hypoglycemic drugs: Secondary | ICD-10-CM | POA: Diagnosis not present

## 2019-03-17 HISTORY — PX: ESOPHAGOGASTRODUODENOSCOPY (EGD) WITH PROPOFOL: SHX5813

## 2019-03-17 LAB — GLUCOSE, CAPILLARY: Glucose-Capillary: 121 mg/dL — ABNORMAL HIGH (ref 70–99)

## 2019-03-17 SURGERY — ESOPHAGOGASTRODUODENOSCOPY (EGD) WITH PROPOFOL
Anesthesia: General

## 2019-03-17 MED ORDER — PROPOFOL 500 MG/50ML IV EMUL
INTRAVENOUS | Status: AC
  Filled 2019-03-17: qty 50

## 2019-03-17 MED ORDER — PROPOFOL 10 MG/ML IV BOLUS
INTRAVENOUS | Status: DC | PRN
  Administered 2019-03-17: 50 mg via INTRAVENOUS

## 2019-03-17 MED ORDER — MIDAZOLAM HCL 5 MG/5ML IJ SOLN
INTRAMUSCULAR | Status: DC | PRN
  Administered 2019-03-17: 2 mg via INTRAVENOUS

## 2019-03-17 MED ORDER — SODIUM CHLORIDE 0.9 % IV SOLN: INTRAVENOUS | Status: DC

## 2019-03-17 MED ORDER — FENTANYL CITRATE (PF) 100 MCG/2ML IJ SOLN
INTRAMUSCULAR | Status: AC
  Filled 2019-03-17: qty 2

## 2019-03-17 MED ORDER — MIDAZOLAM HCL 2 MG/2ML IJ SOLN
INTRAMUSCULAR | Status: AC
  Filled 2019-03-17: qty 2

## 2019-03-17 MED ORDER — LIDOCAINE HCL (PF) 2 % IJ SOLN
INTRAMUSCULAR | Status: DC | PRN
  Administered 2019-03-17: 100 mg

## 2019-03-17 MED ORDER — FENTANYL CITRATE (PF) 100 MCG/2ML IJ SOLN
INTRAMUSCULAR | Status: DC | PRN
  Administered 2019-03-17 (×2): 50 ug via INTRAVENOUS

## 2019-03-17 MED ORDER — PROPOFOL 500 MG/50ML IV EMUL
INTRAVENOUS | Status: DC | PRN
  Administered 2019-03-17: 75 ug/kg/min via INTRAVENOUS

## 2019-03-17 NOTE — Anesthesia Preprocedure Evaluation (Signed)
Anesthesia Evaluation  Patient identified by MRN, date of birth, ID band Patient awake    Reviewed: Allergy & Precautions, NPO status , Patient's Chart, lab work & pertinent test results  History of Anesthesia Complications Negative for: history of anesthetic complications  Airway Mallampati: III  TM Distance: >3 FB Neck ROM: Full    Dental no notable dental hx.    Pulmonary neg pulmonary ROS, neg sleep apnea, neg COPD,    breath sounds clear to auscultation- rhonchi (-) wheezing      Cardiovascular hypertension, Pt. on medications (-) CAD, (-) Past MI, (-) Cardiac Stents and (-) CABG  Rhythm:Regular Rate:Normal - Systolic murmurs and - Diastolic murmurs    Neuro/Psych neg Seizures negative neurological ROS  negative psych ROS   GI/Hepatic negative GI ROS, Neg liver ROS,   Endo/Other  diabetes, Oral Hypoglycemic Agents  Renal/GU negative Renal ROS     Musculoskeletal  (+) Arthritis ,   Abdominal (+) + obese,   Peds  Hematology negative hematology ROS (+)   Anesthesia Other Findings Past Medical History: No date: Diabetes mellitus without complication ([place]) 123456: Hx of pancreatitis No date: Hypertension   Reproductive/Obstetrics                             Anesthesia Physical Anesthesia Plan  ASA: III  Anesthesia Plan: General   Post-op Pain Management:    Induction: Intravenous  PONV Risk Score and Plan: 1 and Propofol infusion  Airway Management Planned: Natural Airway  Additional Equipment:   Intra-op Plan:   Post-operative Plan:   Informed Consent: I have reviewed the patients History and Physical, chart, labs and discussed the procedure including the risks, benefits and alternatives for the proposed anesthesia with the patient or authorized representative who has indicated his/her understanding and acceptance.     Dental advisory given  Plan Discussed with: CRNA  and Anesthesiologist  Anesthesia Plan Comments:         Anesthesia Quick Evaluation

## 2019-03-17 NOTE — Op Note (Signed)
[facility] Gastroenterology Patient Name: Dennis Whitaker Procedure Date: 03/17/2019 10:19 AM MRN: [id number] Account #: [id number] Date of Birth: Oct 02, 1965 Admit Type: Outpatient Age: 54 Room: [facility] ENDO ROOM 3 Gender: Male Note Status: Finalized Procedure:             Upper GI endoscopy Indications:           Dysphagia Providers:             Jonathon Bellows MD, MD Referring MD:          [name] (Referring MD) Medicines:             Monitored Anesthesia Care Complications:         No immediate complications. Procedure:             Pre-Anesthesia Assessment:                        - Prior to the procedure, a History and Physical was                         performed, and patient medications, allergies and                         sensitivities were reviewed. The patient's tolerance                         of previous anesthesia was reviewed.                        - The risks and benefits of the procedure and the                         sedation options and risks were discussed with the                         patient. All questions were answered and informed                         consent was obtained.                        - ASA Grade Assessment: II - A patient with mild                         systemic disease.                        After obtaining informed consent, the endoscope was                         passed under direct vision. Throughout the procedure,                         the patient's blood pressure, pulse, and oxygen                         saturations were monitored continuously. The Endoscope                         was introduced through the mouth, and advanced to  the                         third part of duodenum. The upper GI endoscopy was                         accomplished with ease. The patient tolerated the                         procedure well. Findings:      The examined duodenum was normal.      The stomach was normal.      The  cardia and gastric fundus were normal on retroflexion.      Mucosal changes including ringed esophagus and longitudinal furrows were       found in the entire esophagus. Esophageal findings were graded using the       Eosinophilic Esophagitis Endoscopic Reference Score (EoE-EREFS) as:       Edema Grade 0 Normal (distinct vascular markings), Rings Grade 1 Mild       (subtle circumferential ridges seen on esophageal distension), Exudates       Grade 0 None (no white lesions seen), Furrows Grade 1 Present (vertical       lines with or without visible depth) and Stricture none (no stricture       found). Impression:            - Normal examined duodenum.                        - Normal stomach.                        - Esophageal mucosal changes.                        - No specimens collected. Recommendation:        - Discharge patient to home (with escort).                        - Advance diet as tolerated.                        - Continue present medications.                        - Await pathology results.                        - Return to my office in 4 weeks. Procedure Code(s):     --- Professional ---                        [id number], Esophagogastroduodenoscopy, flexible,                         transoral; diagnostic, including collection of                         specimen(s) by brushing or washing, when performed                         (separate procedure) Diagnosis Code(s):     --- Professional ---  K22.8, Other specified diseases of esophagus                        R13.10, Dysphagia, unspecified CPT copyright 2019 American Medical Association. All rights reserved. The codes documented in this report are preliminary and upon coder review may  be revised to meet current compliance requirements. Jonathon Bellows, MD Jonathon Bellows MD, MD 03/17/2019 10:35:25 AM This report has been signed electronically. Number of Addenda: 0 Note Initiated On: 03/17/2019 10:19  AM Estimated Blood Loss:  Estimated blood loss: none.      [facility]

## 2019-03-17 NOTE — Anesthesia Postprocedure Evaluation (Signed)
Anesthesia Post Note  Patient: Dennis Whitaker  Procedure(s) Performed: ESOPHAGOGASTRODUODENOSCOPY (EGD) WITH PROPOFOL (N/A )  Patient location during evaluation: Endoscopy Anesthesia Type: General Level of consciousness: awake and alert and oriented Pain management: pain level controlled Vital Signs Assessment: post-procedure vital signs reviewed and stable Respiratory status: spontaneous breathing, nonlabored ventilation and respiratory function stable Cardiovascular status: blood pressure returned to baseline and stable Postop Assessment: no signs of nausea or vomiting Anesthetic complications: no     Last Vitals:  Vitals:   03/17/19 1050 03/17/19 1100  BP: 124/78 120/81  Pulse: 70 65  Resp: 17 18  Temp:    SpO2: 96% 95%    Last Pain:  Vitals:   03/17/19 1100  TempSrc:   PainSc: 0-No pain                 Tyshun Tuckerman

## 2019-03-17 NOTE — Transfer of Care (Signed)
Immediate Anesthesia Transfer of Care Note  Patient: Dennis Whitaker  Procedure(s) Performed: ESOPHAGOGASTRODUODENOSCOPY (EGD) WITH PROPOFOL (N/A )  Patient Location: PACU  Anesthesia Type:General  Level of Consciousness: awake, alert  and oriented  Airway & Oxygen Therapy: Patient Spontanous Breathing  Post-op Assessment: Report given to RN and Post -op Vital signs reviewed and stable  Post vital signs: Reviewed and stable  Last Vitals:  Vitals Value Taken Time  BP    Temp    Pulse 78 03/17/19 1039  Resp 18 03/17/19 1039  SpO2 96 % 03/17/19 1039  Vitals shown include unvalidated device data.  Last Pain:  Vitals:   03/17/19 0931  TempSrc: Temporal  PainSc: 5          Complications: No apparent anesthesia complications

## 2019-03-17 NOTE — H&P (Signed)
Jonathon Bellows, MD [address] [place], [place], [place], [address] [address], [place], [place], [place], [address] Phone: [phone]  Fax: [phone]  Primary Care Physician:  [name], MD   Pre-Procedure History & Physical: HPI:  Dennis Whitaker is a 54 y.o. male is here for an endoscopy    Past Medical History:  Diagnosis Date  . Diabetes mellitus without complication ([place])   . Hx of pancreatitis 2015  . Hypertension     Past Surgical History:  Procedure Laterality Date  . CHOLECYSTECTOMY, LAPAROSCOPIC  2015  . COLONOSCOPY  01/2017   normal per pt  . ESOPHAGOGASTRODUODENOSCOPY  2015    Prior to Admission medications   Medication Sig Start Date End Date Taking? Authorizing Provider  amLODipine (NORVASC) 10 MG tablet Take 10 mg by mouth daily. 02/08/19  Yes [provider]  aspirin EC 81 MG tablet Take 81 mg by mouth daily.   Yes [provider]  atorvastatin (LIPITOR) 20 MG tablet Take 20 mg by mouth daily. 02/08/19  Yes [provider]  bisoprolol-hydrochlorothiazide (ZIAC) 5-6.25 MG tablet Take 1 tablet by mouth daily. 02/08/19  Yes [provider]  Cholecalciferol (VITAMIN D-3) 25 MCG (1000 UT) CAPS Take by mouth.   Yes [provider]  lisinopril (ZESTRIL) 40 MG tablet  08/09/18  Yes [provider]  meloxicam (MOBIC) 15 MG tablet meloxicam 15 mg tablet 02/09/17  Yes [provider]  metFORMIN (GLUCOPHAGE) 1000 MG tablet  08/09/18  Yes [provider]  sucralfate (CARAFATE) 1 g tablet Take 1 tablet (1 g total) by mouth 4 (four) times daily -  with meals and at bedtime. 02/15/19  Yes Jonathon Bellows, MD  albuterol (VENTOLIN HFA) [phone] Base) MCG/ACT inhaler  01/08/19   [provider]  clobetasol ointment (TEMOVATE) 0.05 % clobetasol 0.05 % topical ointment    [provider]  DULoxetine (CYMBALTA) 30 MG capsule duloxetine 30 mg capsule,delayed release    [provider]  HYDROcodone-acetaminophen (NORCO/VICODIN) 5-325 MG tablet hydrocodone 5 mg-acetaminophen 325 mg tablet    [provider]  pantoprazole (PROTONIX) 20 MG tablet Take 2 tablets (40 mg total) by mouth daily for 14 days. 08/25/18 09/08/18  [name], MD  traMADol Veatrice Bourbon) 50 MG tablet tramadol 50 mg tablet 01/15/17   [provider]    Allergies as of 02/23/2019  . (No Known Allergies)    Family History  Problem Relation Age of Onset  . Heart failure Mother   . Diabetes Father   . Heart failure Father   . Hypertension Father   . Multiple sclerosis Sister   . Colon cancer Neg Hx     Social History   Socioeconomic History  . Marital status: Married    Spouse name: Not on file  . Number of children: Not on file  . Years of education: Not on file  . Highest education level: Not on file  Occupational History  . Not on file  Tobacco Use  . Smoking status: Never Smoker  . Smokeless tobacco: Current User    Types: Snuff  Substance and Sexual Activity  . Alcohol use: Yes    Comment: about 2 drinks per month  . Drug use: Never  . Sexual activity: Not on file  Other Topics Concern  . Not on file  Social History Narrative  . Not on file   Social Determinants of Health   Financial Resource Strain:   .  Difficulty of Paying Living Expenses: Not on file  Food Insecurity:   . Worried About Charity fundraiser in the Last Year: Not on file  . Ran Out of Food in the Last Year: Not on file  Transportation Needs:   . Lack of Transportation (Medical): Not on file  . Lack of Transportation (Non-Medical): Not on file  Physical Activity:   . Days of Exercise per Week: Not on file  . Minutes of Exercise per Session: Not on file  Stress:   . Feeling of Stress : Not on file  Social Connections:   . Frequency of Communication with Friends and Family: Not on file  . Frequency of Social Gatherings with Friends and Family: Not on file  . Attends  Religious Services: Not on file  . Active Member of Clubs or Organizations: Not on file  . Attends Archivist Meetings: Not on file  . Marital Status: Not on file  Intimate Partner Violence:   . Fear of Current or Ex-Partner: Not on file  . Emotionally Abused: Not on file  . Physically Abused: Not on file  . Sexually Abused: Not on file    Review of Systems: See HPI, otherwise negative ROS  Physical Exam: BP 130/84   Pulse 74   Temp (!) 96.6 F (35.9 C) (Temporal)   Resp 20   Ht 6' (1.829 m)   Wt 122.5 kg   BMI 36.62 kg/m  General:   Alert,  pleasant and cooperative in NAD Head:  Normocephalic and atraumatic. Neck:  Supple; no masses or thyromegaly. Lungs:  Clear throughout to auscultation, normal respiratory effort.    Heart:  +S1, +S2, Regular rate and rhythm, No edema. Abdomen:  Soft, nontender and nondistended. Normal bowel sounds, without guarding, and without rebound.   Neurologic:  Alert and  oriented x4;  grossly normal neurologically.  Impression/Plan: Dennis Whitaker is here for an endoscopy  to be performed for  evaluation of dyspphagia    Risks, benefits, limitations, and alternatives regarding endoscopy have been reviewed with the patient.  Questions have been answered.  All parties agreeable.   Jonathon Bellows, MD  03/17/2019, 10:18 AM

## 2019-03-20 ENCOUNTER — Encounter: Payer: Self-pay | Admitting: *Deleted

## 2019-03-20 LAB — SURGICAL PATHOLOGY

## 2019-03-23 ENCOUNTER — Encounter: Payer: Self-pay | Admitting: Gastroenterology

## 2019-04-18 ENCOUNTER — Encounter: Payer: Self-pay | Admitting: Dietician

## 2019-04-18 ENCOUNTER — Encounter: Payer: BC Managed Care – PPO | Attending: Family Medicine | Admitting: Dietician

## 2019-04-18 ENCOUNTER — Other Ambulatory Visit: Payer: Self-pay

## 2019-04-18 VITALS — Ht 72.0 in | Wt 277.4 lb

## 2019-04-18 DIAGNOSIS — E119 Type 2 diabetes mellitus without complications: Secondary | ICD-10-CM

## 2019-04-18 DIAGNOSIS — Z6837 Body mass index (BMI) 37.0-37.9, adult: Secondary | ICD-10-CM | POA: Diagnosis not present

## 2019-04-18 NOTE — Progress Notes (Signed)
Medical Nutrition Therapy: Visit start time: 0915  end time: 1015 Assessment:  Diagnosis: Type 2 diabetes Medical history changes: no changes Psychosocial issues/ stress concerns: none  Current weight: 277.4lbs Height: 6'0" Medications, supplement changes: reconciled list in medical record  Progress and evaluation:  . Patient reports fasting BGs 110s-120s, sometimes up to 148 after meals; he was having some blurred vision which is gradually improving . Ongoing hip and knee pain; facing hip replacement and then likely knee replacement . GI symptoms have improved, tests reflect normal function.  Marland Kitchen He reports feeling very hungry after work, in evenings, and sometimes has difficulty controlling food portions with supper. . He had one episode of low BG symptoms several days ago; tested BG at 78, ate a snack, but felt hungry and fatigued the rest of the day.   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  Dietary Intake:  Usual eating pattern includes 3 meals and 1-2 snacks per day. Dining out frequency: 1-2 meals per week.  Breakfast: microwave sausage biscuit Snack: none Lunch: leftovers; soup (taco soup with beans, corn, meat, tomatoes) Snack: peanut butter crackers/ animal crackers/ trail mix -- limits/ meat stick and cheese/ yogurt triple zero Supper: meat + starch + veg, ie pork chops (1-3) + broccoli/ green beans, mac and cheese and applesauce Snack: fruit with cool whip ie orange, grapes Beverages: water, tea with sweet n low; coffee with sugar free cream + sweet n low  Nutrition Care Education: Topics covered:  Basic nutrition: appropriate nutrient balance, appropriate meal and snack schedule    Weight control: reviewed progress since previous visit, portion control strategies Advanced nutrition:  recipes  Diabetes: appropriate meal and snack schedule, appropriate carb intake and balance, role of protein,  appropriate treatment for hypoglycemia  Nutritional Diagnosis:  [place]-2.2 Altered nutrition-related laboratory As related to type 2 diabetes.  As evidenced by Recent HbA1C of 9%. [place]-3.3 Overweight/obesity As related to excess calories and limited activity due to pain.  As evidenced by patient with current BMI of 37.5, working on diet changes for weight loss and improved BG control.  Intervention:  . Instruction and discussion as noted above.  Patient continues to work on positive diet changes.   Updated goals for further change.   No additional follow-up scheduled; patient to schedule later if needed.  Education Materials given:  . Recipes . Goals/ instructions   Learner/ who was taught:  . Patient  . Spouse/ partner  Level of understanding: Marland Kitchen Verbalizes/ demonstrates competency   Demonstrated degree of understanding via:   Teach back Learning barriers: . None  Willingness to learn/ readiness for change: . Eager, change in progress   Monitoring and Evaluation:  Dietary intake, exercise, BG control, and body weight      follow up: prn

## 2019-04-18 NOTE — Patient Instructions (Signed)
   Continue with current eating pattern overall.  Control portions of carb foods with dinner, have generous portions of low-carb vegetables and moderate protein portions.   If you have low blood sugar symptoms, treat it by drinking  4oz of fruit juice or sweet tea, which should help improve symptoms within 10-15 minutes. Then follow with a snack or meal that has balanced nutrition (carb, protein, veg)

## 2019-04-25 DIAGNOSIS — M7121 Synovial cyst of popliteal space [Baker], right knee: Secondary | ICD-10-CM | POA: Insufficient documentation

## 2019-05-17 ENCOUNTER — Other Ambulatory Visit: Payer: Self-pay | Admitting: Orthopedic Surgery

## 2019-05-17 ENCOUNTER — Encounter: Payer: Self-pay | Admitting: Orthopedic Surgery

## 2019-05-17 NOTE — H&P (Signed)
NAME: Dennis Whitaker MRN:   [id number] DOB:   17-May-1965     HISTORY AND PHYSICAL  CHIEF COMPLAINT:  Left hip pain  HISTORY:   Dennis Whitaker a 54 y.o. male  with left  Hip Pain Patient complains of left hip pain. Onset of the symptoms was several years ago. Inciting event: known DJD. The patient reports the hip pain is worse with weight bearing. Associated symptoms: none. Aggravating symptoms include: any weight bearing and going up and down stairs. Patient has had no prior hip problems. Previous visits for this problem: multiple, this is a longstanding diagnosis. Last seen several weeks ago by me. Evaluation to date: plain films, which were abnormal  osteoarthritis. Treatment to date: OTC analgesics, which have been somewhat effective, prescription analgesics, which have been somewhat effective, home exercise program, which has been somewhat effective and physical therapy, which has been somewhat effective.   Plan for left total hip replacement  PAST MEDICAL HISTORY:   Past Medical History:  Diagnosis Date  . Diabetes mellitus without complication ([place])   . Hx of pancreatitis 2015  . Hypertension     PAST SURGICAL HISTORY:   Past Surgical History:  Procedure Laterality Date  . CHOLECYSTECTOMY, LAPAROSCOPIC  2015  . COLONOSCOPY  01/2017   normal per pt  . ESOPHAGOGASTRODUODENOSCOPY  2015  . ESOPHAGOGASTRODUODENOSCOPY (EGD) WITH PROPOFOL N/A 03/17/2019   Procedure: ESOPHAGOGASTRODUODENOSCOPY (EGD) WITH PROPOFOL;  Surgeon: Jonathon Bellows, MD;  Location: [facility] ENDOSCOPY;  Service: Gastroenterology;  Laterality: N/A;    MEDICATIONS:  (Not in a hospital admission)   ALLERGIES:  No Known Allergies  REVIEW OF SYSTEMS:   Negative except HPI  FAMILY HISTORY:   Family History  Problem Relation Age of Onset  . Heart failure Mother   . Diabetes Father   . Heart failure Father   . Hypertension Father   . Multiple sclerosis Sister   . Colon cancer Neg Hx     SOCIAL HISTORY:   reports  that he has never smoked. His smokeless tobacco use includes snuff. He reports current alcohol use. He reports that he does not use drugs.  PHYSICAL EXAM:  General appearance: alert, cooperative and no distress Resp: clear to auscultation bilaterally Cardio: regular rate and rhythm, S1, S2 normal, no murmur, click, rub or gallop Extremities: extremities normal, atraumatic, no cyanosis or edema and Homans sign is negative, no sign of DVT Pulses: 2+ and symmetric Skin: Skin color, texture, turgor normal. No rashes or lesions Neurologic: Alert and oriented X 3, normal strength and tone. Normal symmetric reflexes. Normal coordination and gait Incision/Wound:    LABORATORY STUDIES: No results for input(s): WBC, HGB, HCT, PLT in the last 72 hours.  No results for input(s): NA, K, CL, CO2, GLUCOSE, BUN, CREATININE, CALCIUM in the last 72 hours.  STUDIES/RESULTS:  No results found.  ASSESSMENT:  End stage osteoarthritis left hip        Active Problems:   * No active hospital problems. *    PLAN:  Left Primary Total Hip   Carlynn Spry 05/17/2019. 3:10 PM

## 2019-05-18 ENCOUNTER — Encounter
Admission: RE | Admit: 2019-05-18 | Discharge: 2019-05-18 | Disposition: A | Discharge status: Home / Self Care | Payer: BC Managed Care – PPO | Source: Ambulatory Visit | Attending: Orthopedic Surgery | Admitting: Orthopedic Surgery

## 2019-05-18 ENCOUNTER — Other Ambulatory Visit: Payer: Self-pay | Admitting: Orthopedic Surgery

## 2019-05-18 ENCOUNTER — Other Ambulatory Visit: Payer: Self-pay

## 2019-05-18 HISTORY — DX: Unspecified asthma, uncomplicated: J45.909

## 2019-05-18 HISTORY — DX: Sleep apnea, unspecified: G47.30

## 2019-05-18 HISTORY — DX: Gastro-esophageal reflux disease without esophagitis: K21.9

## 2019-05-18 NOTE — Patient Instructions (Addendum)
COVID TESTING Date: Thursday, April 29 Testing site:  [facility] [address] Thru Hours:  [id number] am - 1:00 pm Once you are tested, you are asked to stay quarantined (avoiding public places) until after your surgery.   Your procedure is scheduled on: Monday, May 3 Report to Day Surgery on the 2nd floor of the [facility]. To find out your arrival time, please call [phone] between 1PM - 3PM on: Friday, April 30  REMEMBER: Instructions that are not followed completely may result in serious medical risk, up to and including death; or upon the discretion of your surgeon and anesthesiologist your surgery may need to be rescheduled.  Do not eat food after midnight the night before surgery.  No gum chewing, lozengers or hard candies.  You may however, drink water up to 2 hours before you are scheduled to arrive for your surgery. Do not drink anything within 2 hours of your scheduled arrival time.  TAKE THESE MEDICATIONS THE MORNING OF SURGERY WITH A SIP OF WATER:  1.  Albuterol inhaler 2.  Amlodipine 3.  Prevacid - (take one the night before and one on the morning of surgery - helps to prevent nausea after surgery.) 4.  Sucralfate (Carafate) 5.  Tramadol if needed for pain  Use inhalers on the day of surgery and bring to the hospital.  Stop Metformin 2 days prior to surgery. Last day to take is Friday, April 30  Starting April 26; Stop ASPIRIN, MELOXICAM, Anti-inflammatories (NSAIDS) such as Advil, Aleve, Ibuprofen, Motrin, Naproxen, Naprosyn and Aspirin based products such as Excedrin, Goodys Powder, BC Powder. (May take Tylenol or Acetaminophen if needed.)  Starting April 26; Stop ANY OVER THE COUNTER supplements until after surgery. (May continue Vitamin D.)  No Alcohol for 24 hours before or after surgery.  No Smoking including e-cigarettes for 24 hours prior to surgery.  No chewable tobacco products for at least 6 hours prior to surgery.   No nicotine patches on the day of surgery.  On the morning of surgery brush your teeth with toothpaste and water, you may rinse your mouth with mouthwash if you wish. Do not swallow any toothpaste or mouthwash.  Do not wear jewely.  Do not wear lotions, powders, or perfumes.   Do not shave 48 hours prior to surgery.   Do not bring valuables to the hospital. [facility] is not responsible for any missing/lost belongings or valuables.   Use CHG Soap as directed on instruction sheet.  Notify your doctor if there is any change in your medical condition (cold, fever, infection).  Wear comfortable clothing (specific to your surgery type) to the hospital.  Plan for stool softeners for home use; pain medications have a tendency to cause constipation. You can also help prevent constipation by eating foods high in fiber such as fruits and vegetables and drinking plenty of fluids as your diet allows.  After surgery, you can help prevent lung complications by doing breathing exercises.  Take deep breaths and cough every 1-2 hours. Your doctor may order a device called an Incentive Spirometer to help you take deep breaths. When coughing or sneezing, hold a pillow firmly against your incision with both hands. This is called "splinting." Doing this helps protect your incision. It also decreases belly discomfort.  If you are being admitted to the hospital overnight, leave your suitcase in the car. After surgery it may be brought to your room.  Please call the [place] at [phone] if you have any questions about these instructions.  Visitation Policy:  Patients undergoing a surgery or procedure may have one family member or support person with them as long as that person is not COVID-19 positive or experiencing its symptoms.  That person may remain in the waiting area during the procedure.  Inpatient Visitation Update:  Two designated support people may visit a patient  during visiting hours 7 am to 8 pm. It must be the same two designated people for the duration of the patient stay. The visitors may come and go during the day, and there is no switching out to have different visitors. A mask must be worn at all times, including in the patient room.

## 2019-05-25 ENCOUNTER — Other Ambulatory Visit
Admission: RE | Admit: 2019-05-25 | Discharge: 2019-05-25 | Disposition: A | Discharge status: Home / Self Care | Payer: BC Managed Care – PPO | Source: Ambulatory Visit | Attending: Orthopedic Surgery | Admitting: Orthopedic Surgery

## 2019-05-25 ENCOUNTER — Other Ambulatory Visit: Payer: Self-pay

## 2019-05-25 DIAGNOSIS — Z01812 Encounter for preprocedural laboratory examination: Secondary | ICD-10-CM | POA: Insufficient documentation

## 2019-05-25 DIAGNOSIS — Z20822 Contact with and (suspected) exposure to covid-19: Secondary | ICD-10-CM | POA: Insufficient documentation

## 2019-05-25 LAB — SARS CORONAVIRUS 2 (TAT 6-24 HRS): SARS Coronavirus 2: NEGATIVE

## 2019-05-25 LAB — PROTIME-INR
INR: 1 (ref 0.8–1.2)
Prothrombin Time: 13.2 seconds (ref 11.4–15.2)

## 2019-05-25 LAB — TYPE AND SCREEN
ABO/RH(D): B POS
Antibody Screen: NEGATIVE

## 2019-05-25 LAB — SURGICAL PCR SCREEN
MRSA, PCR: NEGATIVE
Staphylococcus aureus: POSITIVE — AB

## 2019-05-29 ENCOUNTER — Ambulatory Visit: Payer: BC Managed Care – PPO

## 2019-05-29 ENCOUNTER — Ambulatory Visit: Payer: BC Managed Care – PPO | Admitting: Anesthesiology

## 2019-05-29 ENCOUNTER — Other Ambulatory Visit: Payer: Self-pay

## 2019-05-29 ENCOUNTER — Encounter: Payer: Self-pay | Admitting: Orthopedic Surgery

## 2019-05-29 ENCOUNTER — Observation Stay
Admission: RE | Admit: 2019-05-29 | Discharge: 2019-05-31 | Disposition: A | Discharge status: Home / Self Care | Payer: BC Managed Care – PPO | Attending: Orthopedic Surgery | Admitting: Orthopedic Surgery

## 2019-05-29 ENCOUNTER — Encounter
Admission: RE | Disposition: A | Discharge status: Home / Self Care | Payer: Self-pay | Source: Home / Self Care | Attending: Orthopedic Surgery

## 2019-05-29 DIAGNOSIS — G4730 Sleep apnea, unspecified: Secondary | ICD-10-CM | POA: Diagnosis not present

## 2019-05-29 DIAGNOSIS — K219 Gastro-esophageal reflux disease without esophagitis: Secondary | ICD-10-CM | POA: Insufficient documentation

## 2019-05-29 DIAGNOSIS — Z96642 Presence of left artificial hip joint: Secondary | ICD-10-CM

## 2019-05-29 DIAGNOSIS — M25752 Osteophyte, left hip: Secondary | ICD-10-CM | POA: Diagnosis not present

## 2019-05-29 DIAGNOSIS — E119 Type 2 diabetes mellitus without complications: Secondary | ICD-10-CM | POA: Diagnosis not present

## 2019-05-29 DIAGNOSIS — Z79899 Other long term (current) drug therapy: Secondary | ICD-10-CM | POA: Insufficient documentation

## 2019-05-29 DIAGNOSIS — J45909 Unspecified asthma, uncomplicated: Secondary | ICD-10-CM | POA: Insufficient documentation

## 2019-05-29 DIAGNOSIS — I10 Essential (primary) hypertension: Secondary | ICD-10-CM | POA: Insufficient documentation

## 2019-05-29 DIAGNOSIS — Z419 Encounter for procedure for purposes other than remedying health state, unspecified: Secondary | ICD-10-CM

## 2019-05-29 DIAGNOSIS — M1612 Unilateral primary osteoarthritis, left hip: Principal | ICD-10-CM | POA: Insufficient documentation

## 2019-05-29 DIAGNOSIS — Z791 Long term (current) use of non-steroidal anti-inflammatories (NSAID): Secondary | ICD-10-CM | POA: Diagnosis not present

## 2019-05-29 DIAGNOSIS — Z7982 Long term (current) use of aspirin: Secondary | ICD-10-CM | POA: Diagnosis not present

## 2019-05-29 HISTORY — PX: TOTAL HIP ARTHROPLASTY: SHX124

## 2019-05-29 LAB — ABO/RH: ABO/RH(D): B POS

## 2019-05-29 LAB — GLUCOSE, CAPILLARY
Glucose-Capillary: 220 mg/dL — ABNORMAL HIGH (ref 70–99)
Glucose-Capillary: 182 mg/dL — ABNORMAL HIGH (ref 70–99)
Glucose-Capillary: 161 mg/dL — ABNORMAL HIGH (ref 70–99)
Glucose-Capillary: 131 mg/dL — ABNORMAL HIGH (ref 70–99)
Glucose-Capillary: 183 mg/dL — ABNORMAL HIGH (ref 70–99)

## 2019-05-29 LAB — HEMOGLOBIN A1C
Hgb A1c MFr Bld: 7.5 % — ABNORMAL HIGH (ref 4.8–5.6)
Mean Plasma Glucose: 168.55 mg/dL

## 2019-05-29 SURGERY — ARTHROPLASTY, HIP, TOTAL, ANTERIOR APPROACH
Anesthesia: Spinal | Site: Hip | Laterality: Left

## 2019-05-29 MED ORDER — AMLODIPINE BESYLATE 10 MG PO TABS
10.0000 mg | ORAL_TABLET | Freq: Every day | ORAL | Status: DC
  Administered 2019-05-30 – 2019-05-31 (×2): 10 mg via ORAL
  Filled 2019-05-29 (×2): qty 1

## 2019-05-29 MED ORDER — TRANEXAMIC ACID-NACL 1000-0.7 MG/100ML-% IV SOLN
INTRAVENOUS | Status: AC
  Filled 2019-05-29: qty 100

## 2019-05-29 MED ORDER — KETOROLAC TROMETHAMINE 15 MG/ML IJ SOLN
15.0000 mg | Freq: Four times a day (QID) | INTRAMUSCULAR | Status: AC
  Administered 2019-05-29 – 2019-05-30 (×3): 15 mg via INTRAVENOUS
  Filled 2019-05-29 (×3): qty 1

## 2019-05-29 MED ORDER — INSULIN ASPART 100 UNIT/ML ~~LOC~~ SOLN
5.0000 [IU] | Freq: Once | SUBCUTANEOUS | Status: AC
  Administered 2019-05-29: 5 [IU] via SUBCUTANEOUS

## 2019-05-29 MED ORDER — MIDAZOLAM HCL 5 MG/5ML IJ SOLN
INTRAMUSCULAR | Status: DC | PRN
  Administered 2019-05-29: 2 mg via INTRAVENOUS

## 2019-05-29 MED ORDER — SODIUM CHLORIDE 0.9 % IR SOLN
Status: DC | PRN
  Administered 2019-05-29: 20 mL

## 2019-05-29 MED ORDER — INSULIN ASPART 100 UNIT/ML ~~LOC~~ SOLN
0.0000 [IU] | Freq: Three times a day (TID) | SUBCUTANEOUS | Status: DC
  Administered 2019-05-30: 13:00:00 3 [IU] via SUBCUTANEOUS
  Filled 2019-05-29 (×3): qty 1

## 2019-05-29 MED ORDER — METFORMIN HCL 500 MG PO TABS
1000.0000 mg | ORAL_TABLET | Freq: Two times a day (BID) | ORAL | Status: DC
  Administered 2019-05-29 – 2019-05-31 (×4): 1000 mg via ORAL
  Filled 2019-05-29 (×4): qty 2

## 2019-05-29 MED ORDER — PHENOL 1.4 % MT LIQD
1.0000 | OROMUCOSAL | Status: DC | PRN
  Filled 2019-05-29: qty 177

## 2019-05-29 MED ORDER — SEVOFLURANE IN SOLN
RESPIRATORY_TRACT | Status: AC
  Filled 2019-05-29: qty 250

## 2019-05-29 MED ORDER — EPHEDRINE 5 MG/ML INJ
INTRAVENOUS | Status: AC
  Filled 2019-05-29: qty 10

## 2019-05-29 MED ORDER — BUPIVACAINE HCL (PF) 0.5 % IJ SOLN
INTRAMUSCULAR | Status: DC | PRN
  Administered 2019-05-29: 3 mL

## 2019-05-29 MED ORDER — ASPIRIN 81 MG PO CHEW
81.0000 mg | CHEWABLE_TABLET | Freq: Two times a day (BID) | ORAL | Status: DC
  Administered 2019-05-29 – 2019-05-31 (×4): 81 mg via ORAL
  Filled 2019-05-29 (×4): qty 1

## 2019-05-29 MED ORDER — ONDANSETRON HCL 4 MG/2ML IJ SOLN: 4.0000 mg | Freq: Four times a day (QID) | INTRAMUSCULAR | Status: DC | PRN

## 2019-05-29 MED ORDER — ONDANSETRON HCL 4 MG PO TABS: 4.0000 mg | ORAL_TABLET | Freq: Four times a day (QID) | ORAL | Status: DC | PRN

## 2019-05-29 MED ORDER — ALBUTEROL SULFATE (2.5 MG/3ML) 0.083% IN NEBU: 2.5000 mg | INHALATION_SOLUTION | Freq: Four times a day (QID) | RESPIRATORY_TRACT | Status: DC | PRN

## 2019-05-29 MED ORDER — MENTHOL 3 MG MT LOZG
1.0000 | LOZENGE | OROMUCOSAL | Status: DC | PRN
  Filled 2019-05-29: qty 9

## 2019-05-29 MED ORDER — METHOCARBAMOL 1000 MG/10ML IJ SOLN
500.0000 mg | Freq: Four times a day (QID) | INTRAVENOUS | Status: DC | PRN
  Filled 2019-05-29: qty 5

## 2019-05-29 MED ORDER — PROPOFOL 500 MG/50ML IV EMUL
INTRAVENOUS | Status: AC
  Filled 2019-05-29: qty 50

## 2019-05-29 MED ORDER — LISINOPRIL 20 MG PO TABS
40.0000 mg | ORAL_TABLET | Freq: Every day | ORAL | Status: DC
  Administered 2019-05-30 – 2019-05-31 (×2): 40 mg via ORAL
  Filled 2019-05-29 (×2): qty 2

## 2019-05-29 MED ORDER — PROPOFOL 500 MG/50ML IV EMUL
INTRAVENOUS | Status: DC | PRN
  Administered 2019-05-29: 70 ug/kg/min via INTRAVENOUS

## 2019-05-29 MED ORDER — PROPOFOL 10 MG/ML IV BOLUS
INTRAVENOUS | Status: DC | PRN
  Administered 2019-05-29: 120 mg via INTRAVENOUS

## 2019-05-29 MED ORDER — MAGNESIUM CITRATE PO SOLN
1.0000 | Freq: Once | ORAL | Status: DC | PRN
  Filled 2019-05-29: qty 296

## 2019-05-29 MED ORDER — SODIUM CHLORIDE (PF) 0.9 % IJ SOLN
INTRAMUSCULAR | Status: AC
  Filled 2019-05-29: qty 50

## 2019-05-29 MED ORDER — PHENYLEPHRINE HCL (PRESSORS) 10 MG/ML IV SOLN
INTRAVENOUS | Status: DC | PRN
  Administered 2019-05-29: 100 ug via INTRAVENOUS
  Administered 2019-05-29: 150 ug via INTRAVENOUS
  Administered 2019-05-29: 100 ug via INTRAVENOUS
  Administered 2019-05-29 (×3): 150 ug via INTRAVENOUS
  Administered 2019-05-29: 100 ug via INTRAVENOUS
  Administered 2019-05-29: 150 ug via INTRAVENOUS

## 2019-05-29 MED ORDER — SODIUM CHLORIDE 0.9 % IV SOLN
INTRAVENOUS | Status: DC | PRN
  Administered 2019-05-29: 50 ug/min via INTRAVENOUS

## 2019-05-29 MED ORDER — HYDROCODONE-ACETAMINOPHEN 7.5-325 MG PO TABS
1.0000 | ORAL_TABLET | ORAL | Status: DC | PRN
  Administered 2019-05-29: 2 via ORAL
  Filled 2019-05-29: qty 2

## 2019-05-29 MED ORDER — BUPIVACAINE-EPINEPHRINE (PF) 0.25% -1:200000 IJ SOLN
INTRAMUSCULAR | Status: DC | PRN
  Administered 2019-05-29: 20 mL

## 2019-05-29 MED ORDER — PANTOPRAZOLE SODIUM 20 MG PO TBEC
20.0000 mg | DELAYED_RELEASE_TABLET | Freq: Every day | ORAL | Status: DC
  Administered 2019-05-30 – 2019-05-31 (×2): 20 mg via ORAL
  Filled 2019-05-29 (×2): qty 1

## 2019-05-29 MED ORDER — ALBUTEROL SULFATE HFA 108 (90 BASE) MCG/ACT IN AERS: 2.0000 | INHALATION_SPRAY | Freq: Four times a day (QID) | RESPIRATORY_TRACT | Status: DC | PRN

## 2019-05-29 MED ORDER — GLIPIZIDE 5 MG PO TABS
5.0000 mg | ORAL_TABLET | Freq: Every day | ORAL | Status: DC
  Administered 2019-05-30 – 2019-05-31 (×2): 5 mg via ORAL
  Filled 2019-05-29 (×3): qty 1

## 2019-05-29 MED ORDER — BISOPROLOL-HYDROCHLOROTHIAZIDE 5-6.25 MG PO TABS
1.0000 | ORAL_TABLET | Freq: Every day | ORAL | Status: DC
  Administered 2019-05-30 – 2019-05-31 (×2): 1 via ORAL
  Filled 2019-05-29 (×3): qty 1

## 2019-05-29 MED ORDER — MIDAZOLAM HCL 2 MG/2ML IJ SOLN
INTRAMUSCULAR | Status: AC
  Filled 2019-05-29: qty 2

## 2019-05-29 MED ORDER — TRANEXAMIC ACID-NACL 1000-0.7 MG/100ML-% IV SOLN
1000.0000 mg | INTRAVENOUS | Status: AC
  Administered 2019-05-29: 1000 mg via INTRAVENOUS

## 2019-05-29 MED ORDER — CEFAZOLIN SODIUM-DEXTROSE 2-4 GM/100ML-% IV SOLN
2.0000 g | Freq: Four times a day (QID) | INTRAVENOUS | Status: AC
  Administered 2019-05-29 (×2): 2 g via INTRAVENOUS
  Filled 2019-05-29 (×2): qty 100

## 2019-05-29 MED ORDER — LACTATED RINGERS IV SOLN: INTRAVENOUS | Status: DC

## 2019-05-29 MED ORDER — DOCUSATE SODIUM 100 MG PO CAPS
100.0000 mg | ORAL_CAPSULE | Freq: Two times a day (BID) | ORAL | Status: DC
  Administered 2019-05-29 – 2019-05-31 (×4): 100 mg via ORAL
  Filled 2019-05-29 (×5): qty 1

## 2019-05-29 MED ORDER — MAGNESIUM HYDROXIDE 400 MG/5ML PO SUSP: 30.0000 mL | Freq: Every day | ORAL | Status: DC | PRN

## 2019-05-29 MED ORDER — BACITRACIN 50000 UNITS IM SOLR
INTRAMUSCULAR | Status: AC
  Filled 2019-05-29: qty 2

## 2019-05-29 MED ORDER — BUPIVACAINE-EPINEPHRINE (PF) 0.25% -1:200000 IJ SOLN
INTRAMUSCULAR | Status: AC
  Filled 2019-05-29: qty 30

## 2019-05-29 MED ORDER — HYDROCODONE-ACETAMINOPHEN 5-325 MG PO TABS
1.0000 | ORAL_TABLET | ORAL | Status: DC | PRN
  Administered 2019-05-30: 08:00:00 2 via ORAL
  Filled 2019-05-29: qty 2

## 2019-05-29 MED ORDER — SODIUM CHLORIDE 0.9 % IV SOLN: INTRAVENOUS | Status: DC

## 2019-05-29 MED ORDER — SUCRALFATE 1 G PO TABS
1.0000 g | ORAL_TABLET | Freq: Three times a day (TID) | ORAL | Status: DC
  Administered 2019-05-29 – 2019-05-31 (×5): 1 g via ORAL
  Filled 2019-05-29 (×5): qty 1

## 2019-05-29 MED ORDER — CEFAZOLIN SODIUM-DEXTROSE 2-4 GM/100ML-% IV SOLN
INTRAVENOUS | Status: AC
  Filled 2019-05-29: qty 100

## 2019-05-29 MED ORDER — ACETAMINOPHEN 325 MG PO TABS
325.0000 mg | ORAL_TABLET | Freq: Four times a day (QID) | ORAL | Status: DC | PRN
  Administered 2019-05-30 – 2019-05-31 (×2): 650 mg via ORAL
  Filled 2019-05-29 (×2): qty 2

## 2019-05-29 MED ORDER — POVIDONE-IODINE 10 % EX SWAB
2.0000 "application " | Freq: Once | CUTANEOUS | Status: AC
  Administered 2019-05-29: 2 via TOPICAL

## 2019-05-29 MED ORDER — INSULIN ASPART 100 UNIT/ML ~~LOC~~ SOLN
SUBCUTANEOUS | Status: AC
  Filled 2019-05-29: qty 1

## 2019-05-29 MED ORDER — MORPHINE SULFATE (PF) 2 MG/ML IV SOLN
0.5000 mg | INTRAVENOUS | Status: DC | PRN
  Administered 2019-05-29 – 2019-05-30 (×3): 1 mg via INTRAVENOUS
  Filled 2019-05-29 (×3): qty 1

## 2019-05-29 MED ORDER — EPHEDRINE SULFATE 50 MG/ML IJ SOLN
INTRAMUSCULAR | Status: DC | PRN
  Administered 2019-05-29 (×2): 10 mg via INTRAVENOUS
  Administered 2019-05-29: 5 mg via INTRAVENOUS
  Administered 2019-05-29 (×2): 10 mg via INTRAVENOUS

## 2019-05-29 MED ORDER — METOCLOPRAMIDE HCL 10 MG PO TABS: 5.0000 mg | ORAL_TABLET | Freq: Three times a day (TID) | ORAL | Status: DC | PRN

## 2019-05-29 MED ORDER — ALUM & MAG HYDROXIDE-SIMETH 200-200-20 MG/5ML PO SUSP: 30.0000 mL | ORAL | Status: DC | PRN

## 2019-05-29 MED ORDER — BISACODYL 10 MG RE SUPP: 10.0000 mg | Freq: Every day | RECTAL | Status: DC | PRN

## 2019-05-29 MED ORDER — METOCLOPRAMIDE HCL 5 MG/ML IJ SOLN: 5.0000 mg | Freq: Three times a day (TID) | INTRAMUSCULAR | Status: DC | PRN

## 2019-05-29 MED ORDER — CEFAZOLIN SODIUM-DEXTROSE 2-4 GM/100ML-% IV SOLN
2.0000 g | INTRAVENOUS | Status: AC
  Administered 2019-05-29: 2 g via INTRAVENOUS

## 2019-05-29 MED ORDER — METHOCARBAMOL 500 MG PO TABS
500.0000 mg | ORAL_TABLET | Freq: Four times a day (QID) | ORAL | Status: DC | PRN
  Administered 2019-05-29: 500 mg via ORAL
  Filled 2019-05-29: qty 1

## 2019-05-29 MED ORDER — ACETAMINOPHEN 500 MG PO TABS
500.0000 mg | ORAL_TABLET | Freq: Four times a day (QID) | ORAL | Status: AC
  Administered 2019-05-29 – 2019-05-30 (×3): 500 mg via ORAL
  Filled 2019-05-29 (×4): qty 1

## 2019-05-29 MED ORDER — ATORVASTATIN CALCIUM 20 MG PO TABS
20.0000 mg | ORAL_TABLET | Freq: Every day | ORAL | Status: DC
  Administered 2019-05-30: 20 mg via ORAL
  Filled 2019-05-29: qty 1

## 2019-05-29 SURGICAL SUPPLY — 49 items
BLADE SAGITTAL WIDE XTHICK NO (BLADE) ×2 IMPLANT
BRUSH SCRUB EZ  4% CHG (MISCELLANEOUS) ×2
BRUSH SCRUB EZ 4% CHG (MISCELLANEOUS) ×2 IMPLANT
CHLORAPREP W/TINT 26 (MISCELLANEOUS) ×2 IMPLANT
COVER HOLE (Hips) ×1 IMPLANT
COVER WAND RF STERILE (DRAPES) ×2 IMPLANT
CUP R3 54MM 3 HOLE (Hips) ×1 IMPLANT
DRAPE 3/4 80X56 (DRAPES) ×2 IMPLANT
DRAPE C-ARM 42X72 X-RAY (DRAPES) ×2 IMPLANT
DRAPE STERI IOBAN 125X83 (DRAPES) IMPLANT
DRSG AQUACEL AG ADV 3.5X10 (GAUZE/BANDAGES/DRESSINGS) ×1 IMPLANT
DRSG AQUACEL AG ADV 3.5X14 (GAUZE/BANDAGES/DRESSINGS) IMPLANT
ELECT BLADE 6.5 EXT (BLADE) ×2 IMPLANT
ELECT REM PT RETURN 9FT ADLT (ELECTROSURGICAL) ×2
ELECTRODE REM PT RTRN 9FT ADLT (ELECTROSURGICAL) ×1 IMPLANT
GAUZE XEROFORM 1X8 LF (GAUZE/BANDAGES/DRESSINGS) IMPLANT
GLOVE INDICATOR 8.0 STRL GRN (GLOVE) ×2 IMPLANT
GLOVE SURG ORTHO 8.0 STRL STRW (GLOVE) ×4 IMPLANT
GOWN STRL REUS W/ TWL LRG LVL3 (GOWN DISPOSABLE) ×1 IMPLANT
GOWN STRL REUS W/ TWL XL LVL3 (GOWN DISPOSABLE) ×1 IMPLANT
GOWN STRL REUS W/TWL LRG LVL3 (GOWN DISPOSABLE) ×1
GOWN STRL REUS W/TWL XL LVL3 (GOWN DISPOSABLE) ×1
HEAD FEMORAL TAPER 36MM P0 (Head) ×1 IMPLANT
HOOD PEEL AWAY FLYTE STAYCOOL (MISCELLANEOUS) ×6 IMPLANT
IV NS 1000ML (IV SOLUTION) ×1
IV NS 1000ML BAXH (IV SOLUTION) ×1 IMPLANT
KIT PATIENT CARE HANA TABLE (KITS) ×2 IMPLANT
KIT TURNOVER CYSTO (KITS) ×2 IMPLANT
LINER ACETABULAR 36X54 OD (Liner) ×1 IMPLANT
MAT ABSORB  FLUID 56X50 GRAY (MISCELLANEOUS) ×1
MAT ABSORB FLUID 56X50 GRAY (MISCELLANEOUS) ×1 IMPLANT
NDL SAFETY ECLIPSE 18X1.5 (NEEDLE) ×2 IMPLANT
NDL SPNL 20GX3.5 QUINCKE YW (NEEDLE) ×1 IMPLANT
NEEDLE HYPO 18GX1.5 SHARP (NEEDLE) ×2
NEEDLE HYPO 22GX1.5 SAFETY (NEEDLE) ×2 IMPLANT
NEEDLE SPNL 20GX3.5 QUINCKE YW (NEEDLE) ×2 IMPLANT
PACK HIP PROSTHESIS (MISCELLANEOUS) ×2 IMPLANT
PADDING CAST BLEND 4X4 NS (MISCELLANEOUS) ×4 IMPLANT
PILLOW ABDUCTION MEDIUM (MISCELLANEOUS) ×2 IMPLANT
PULSAVAC PLUS IRRIG FAN TIP (DISPOSABLE) ×2
SCREW 6.5X25MM (Screw) ×1 IMPLANT
STAPLER SKIN PROX 35W (STAPLE) ×2 IMPLANT
STEM FEM LAT CMNTLESS CLLR SZ6 (Stem) ×1 IMPLANT
SUT BONE WAX W31G (SUTURE) ×2 IMPLANT
SUT DVC 2 QUILL PDO  T11 36X36 (SUTURE) ×1
SUT DVC 2 QUILL PDO T11 36X36 (SUTURE) ×1 IMPLANT
SUT VIC AB 2-0 CT1 18 (SUTURE) ×2 IMPLANT
SYR 20ML LL LF (SYRINGE) ×2 IMPLANT
TIP FAN IRRIG PULSAVAC PLUS (DISPOSABLE) ×1 IMPLANT

## 2019-05-29 NOTE — Anesthesia Preprocedure Evaluation (Addendum)
Anesthesia Evaluation  Patient identified by MRN, date of birth, ID band Patient awake    Reviewed: Allergy & Precautions, H&P , NPO status , Patient's Chart, lab work & pertinent test results  Airway Mallampati: III  TM Distance: >3 FB Neck ROM: full    Dental  (+) Teeth Intact   Pulmonary asthma , sleep apnea ,    breath sounds clear to auscultation       Cardiovascular Exercise Tolerance: Good hypertension,  Rhythm:regular Rate:Normal     Neuro/Psych    GI/Hepatic GERD  Controlled,  Endo/Other  diabetes  Renal/GU   negative genitourinary   Musculoskeletal   Abdominal   Peds  Hematology negative hematology ROS (+)   Anesthesia Other Findings Past Medical History: No date: Asthma No date: Diabetes mellitus without complication (HCC) No date: GERD (gastroesophageal reflux disease) 2015: Hx of pancreatitis No date: Hypertension No date: Sleep apnea  Past Surgical History: No date: CHOLECYSTECTOMY 2015: CHOLECYSTECTOMY, LAPAROSCOPIC 01/2017: COLONOSCOPY     Comment:  normal per pt 2015: ESOPHAGOGASTRODUODENOSCOPY 03/17/2019: ESOPHAGOGASTRODUODENOSCOPY (EGD) WITH PROPOFOL; N/A     Comment:  Procedure: ESOPHAGOGASTRODUODENOSCOPY (EGD) WITH               PROPOFOL;  Surgeon: Jonathon Bellows, MD;  Location: [facility]               ENDOSCOPY;  Service: Gastroenterology;  Laterality: N/A; No date: KNEE ARTHROSCOPY; Right     Comment:  x 2 No date: TONSILLECTOMY     Reproductive/Obstetrics (+) Pregnancy                            Anesthesia Physical Anesthesia Plan  ASA: III  Anesthesia Plan: Spinal   Post-op Pain Management:    Induction:   PONV Risk Score and Plan:   Airway Management Planned: Simple Face Mask and Natural Airway  Additional Equipment:   Intra-op Plan:   Post-operative Plan:   Informed Consent: I have reviewed the patients History and Physical, chart, labs and  discussed the procedure including the risks, benefits and alternatives for the proposed anesthesia with the patient or authorized representative who has indicated his/her understanding and acceptance.     Dental Advisory Given  Plan Discussed with: Anesthesiologist  Anesthesia Plan Comments:         Anesthesia Quick Evaluation

## 2019-05-29 NOTE — Transfer of Care (Signed)
Immediate Anesthesia Transfer of Care Note  Patient: Dennis Whitaker  Procedure(s) Performed: TOTAL HIP ARTHROPLASTY ANTERIOR APPROACH (Left Hip)  Patient Location: PACU  Anesthesia Type:Spinal  Level of Consciousness: awake, alert  and oriented  Airway & Oxygen Therapy: Patient Spontanous Breathing and Patient connected to face mask oxygen  Post-op Assessment: Report given to RN and Post -op Vital signs reviewed and stable  Post vital signs: Reviewed and stable  Last Vitals:  Vitals Value Taken Time  BP    Temp    Pulse    Resp    SpO2      Last Pain:  Vitals:   05/29/19 1004  TempSrc: Tympanic  PainSc: 6          Complications: No apparent anesthesia complications

## 2019-05-29 NOTE — Evaluation (Signed)
Physical Therapy Evaluation Patient Details Name: Dennis Whitaker MRN: [id number] DOB: [birth year] Today's Date: 05/29/2019   History of Present Illness  Pt is a 54 yo male diagnosed with OA of the L hip and is s/p elective L THA.  PMH includes: DM, HTN, sleep apnea, and GERD.    Clinical Impression  Pt pleasant and motivated to participate during the session.  Pt performed very well during the session especially considering POD#0 status.  Pt required no physical assistance with bed mobility tasks or sit to/from stand transfers and was steady upon initial stand and during standing marching.  After performing standing therex for 20-30 sec pt began to c/o feeling dizzy and returned to sitting with BP taken in sitting at 108/66, nursing notified and pt returned to supine.  Pt should make very good progress while in acute care and will benefit from HHPT services upon discharge to safely address deficits listed in patient problem list for decreased caregiver assistance and eventual return to PLOF.      Follow Up Recommendations Home health PT;Supervision for mobility/OOB    Equipment Recommendations  None recommended by PT    Recommendations for Other Services       Precautions / Restrictions Precautions Precautions: Anterior Hip Precaution Booklet Issued: Yes (comment) Restrictions Weight Bearing Restrictions: Yes LLE Weight Bearing: Weight bearing as tolerated      Mobility  Bed Mobility Overal bed mobility: Modified Independent             General bed mobility comments: Extra time and effort along with use of the bed rail required  Transfers Overall transfer level: Needs assistance Equipment used: Rolling walker (2 wheeled) Transfers: Sit to/from Stand Sit to Stand: Min guard         General transfer comment: Min verbal cues for sequencing with good eccentric and concentric control; pt able to stand and perform BLE marching in place but then returned to sitting secondary  to dizziness with BP 108/66  Ambulation/Gait             General Gait Details: Deferred secondary to dizziness in standing  Stairs            Wheelchair Mobility    Modified Rankin (Stroke Patients Only)       Balance Overall balance assessment: No apparent balance deficits (not formally assessed)                                           Pertinent Vitals/Pain Pain Assessment: 0-10 Pain Score: 7  Pain Location: L hip Pain Descriptors / Indicators: Sore;Grimacing;Aching Pain Intervention(s): Premedicated before session;Monitored during session    Home Living Family/patient expects to be discharged to:: Private residence Living Arrangements: Spouse/significant other;Children Available Help at Discharge: Family;Available 24 hours/day Type of Home: House Home Access: Stairs to enter Entrance Stairs-Rails: Left Entrance Stairs-Number of Steps: 5 Home Layout: One level Home Equipment: Bedside commode;Walker - 2 wheels;Crutches      Prior Function Level of Independence: Independent         Comments: Ind amb without AD community distances, Ind with all ADLs/IADLs except spouse assisted with shoes/socks, no fall history     Hand Dominance        Extremity/Trunk Assessment   Upper Extremity Assessment Upper Extremity Assessment: Overall WFL for tasks assessed    Lower Extremity Assessment Lower Extremity Assessment: RLE deficits/detail;LLE deficits/detail  RLE Deficits / Details: BLE ankle PF/DF AROM and strength WNL; RLE strength grossly WNL RLE Sensation: WNL RLE Coordination: WNL LLE Deficits / Details: BLE ankle PF/DF AROM and strength WNL LLE: Unable to fully assess due to pain LLE Sensation: WNL LLE Coordination: WNL       Communication   Communication: No difficulties  Cognition Arousal/Alertness: Awake/alert Behavior During Therapy: WFL for tasks assessed/performed Overall Cognitive Status: Within Functional Limits for  tasks assessed                                        General Comments      Exercises Total Joint Exercises Ankle Circles/Pumps: Strengthening;AROM;Both;15 reps Quad Sets: Strengthening;Both;15 reps Gluteal Sets: Strengthening;Both;15 reps Long Arc Quad: Strengthening;Both;15 reps Knee Flexion: Strengthening;Both;15 reps Marching in Standing: AROM;Strengthening;Both;10 reps;Standing Other Exercises Other Exercises: Hip precaution review and HEP education per handout   Assessment/Plan    PT Assessment Patient needs continued PT services  PT Problem List Decreased strength;Decreased activity tolerance;Decreased mobility;Decreased knowledge of use of DME;Decreased knowledge of precautions;Pain;Decreased balance       PT Treatment Interventions DME instruction;Gait training;Stair training;Functional mobility training;Therapeutic activities;Therapeutic exercise;Balance training;Patient/family education    PT Goals (Current goals can be found in the Care Plan section)  Acute Rehab PT Goals Patient Stated Goal: To walk without pain PT Goal Formulation: With patient Time For Goal Achievement: 06/11/19 Potential to Achieve Goals: Good    Frequency BID   Barriers to discharge        Co-evaluation               AM-PAC PT "6 Clicks" Mobility  Outcome Measure Help needed turning from your back to your side while in a flat bed without using bedrails?: A Little Help needed moving from lying on your back to sitting on the side of a flat bed without using bedrails?: A Little Help needed moving to and from a bed to a chair (including a wheelchair)?: A Little Help needed standing up from a chair using your arms (e.g., wheelchair or bedside chair)?: A Little Help needed to walk in hospital room?: A Little Help needed climbing 3-5 steps with a railing? : A Lot 6 Click Score: 17    End of Session Equipment Utilized During Treatment: Gait belt Activity Tolerance:  Other (comment)(Pt limited by dizziness in standing) Patient left: in bed;with SCD's reapplied;with call bell/phone within reach;with family/visitor present Nurse Communication: Mobility status;Precautions;Weight bearing status;Other (comment)(Pt bed not functioning including bed alarm) PT Visit Diagnosis: Other abnormalities of gait and mobility (R26.89);Muscle weakness (generalized) (M62.81);Pain Pain - Right/Left: Left Pain - part of body: Hip    Time: [id number] PT Time Calculation (min) (ACUTE ONLY): 36 min   Charges:   PT Evaluation $PT Eval Moderate Complexity: 1 Mod PT Treatments $Therapeutic Exercise: 8-22 mins        D. Royetta Asal PT, DPT 05/29/19, 5:37 PM

## 2019-05-29 NOTE — H&P (Signed)
The patient has been re-examined, and the chart reviewed, and there have been no interval changes to the documented history and physical.  Plan a left total hip today.  Anesthesia is not consulted regarding a peripheral nerve block for post-operative pain.  The risks, benefits, and alternatives have been discussed at length, and the patient is willing to proceed.    

## 2019-05-29 NOTE — Progress Notes (Signed)
Dr. Lubertha Basque made aware of cbg 220. Verbal orders given for 5units of novolog. See [facility]

## 2019-05-29 NOTE — Anesthesia Procedure Notes (Signed)
Spinal  Patient location during procedure: OR Start time: 05/29/2019 12:30 PM End time: 05/29/2019 12:40 PM Staffing Performed: resident/CRNA  Resident/CRNA: Esaw Grandchild, CRNA Preanesthetic Checklist Completed: patient identified, IV checked, site marked, risks and benefits discussed, surgical consent, monitors and equipment checked, pre-op evaluation and timeout performed Spinal Block Patient position: sitting Prep: DuraPrep and site prepped and draped Patient monitoring: heart rate, continuous pulse ox and blood pressure Approach: midline Location: L2-3 Injection technique: single-shot Needle Needle type: Sprotte  Needle gauge: 24 G Needle length: 9 cm Assessment Sensory level: T4

## 2019-05-29 NOTE — Op Note (Signed)
05/29/2019  2:33 PM  PATIENT:  Dennis Whitaker   MRN: [id number]  PRE-OPERATIVE DIAGNOSIS:  Osteoarthritis left hip   POST-OPERATIVE DIAGNOSIS: Same  Procedure: Left Total Hip Replacement  Surgeon: Elyn Aquas. Harlow Mares, MD   Assist: Carlynn Spry, PA-C  Anesthesia: Spinal   EBL: 200 mL   Specimens: None   Drains: None   Components used: A size 6 Polarstem [employer], R3 size 54 mm shell, and a 36 +0 mm head    Description of the procedure in detail: After informed consent was obtained and the appropriate extremity marked in the pre-operative holding area, the patient was taken to the operating room and placed in the supine position on the fracture table. All pressure points were well padded and bilateral lower extremities were place in traction spars. The hip was prepped and draped in standard sterile fashion. A spinal anesthetic had been delivered by the anesthesia team. The skin and subcutaneous tissues were injected with a mixture of Marcaine with epinephrine for post-operative pain. A longitudinal incision approximately 10 cm in length was carried out from the anterior superior iliac spine to the greater trochanter. The tensor fascia was divided and blunt dissection was taken down to the level of the joint capsule. The lateral circumflex vessels were cauterized. Deep retractors were placed and a portion of the anterior capsule was excised. Using fluoroscopy the neck cut was planned and carried out with a sagittal saw. The head was passed from the field with use of a corkscrew and hip skid. Deep retractors were placed along the acetabulum and the degenerative labrum and large osteophytes were removed with a Rongeur. The cup was sequentially reamed to a size 54 mm. The wound was irrigated and using fluoroscopy the size 54 mm cup was impacted in to anatomic position. A single screw was placed followed by a threaded hole cover. The final liner was impacted in to position. Attention was then  turned to the proximal femur. The leg was placed in extension and external rotation. The canal was opened and sequentially broached to a size 6. The trial components were placed and the hip relocated. The components were found to be in good position using fluoroscopy. The hip was dislocated and the trial components removed. The final components were impacted in to position and the hip relocated. The final components were again check with fluoroscopy and found to be in good position. Hemostasis was achieved with electrocautery. The deep capsule was injected with Marcaine and epinephrine. The wound was irrigated with bacitracin laced normal saline and the tensor fascia closed with #2 Quill suture. The subcutaneous tissues were closed with 2-0 vicryl and staples for the skin. A sterile dressing was applied and an abduction pillow. Patient tolerated the procedure well and there were no apparent complication. Patient was taken to the recovery room in good condition.   Kurtis Bushman, MD

## 2019-05-30 DIAGNOSIS — M1612 Unilateral primary osteoarthritis, left hip: Secondary | ICD-10-CM | POA: Diagnosis not present

## 2019-05-30 LAB — GLUCOSE, CAPILLARY
Glucose-Capillary: 153 mg/dL — ABNORMAL HIGH (ref 70–99)
Glucose-Capillary: 196 mg/dL — ABNORMAL HIGH (ref 70–99)
Glucose-Capillary: 135 mg/dL — ABNORMAL HIGH (ref 70–99)
Glucose-Capillary: 208 mg/dL — ABNORMAL HIGH (ref 70–99)

## 2019-05-30 LAB — BASIC METABOLIC PANEL
Anion gap: 6 (ref 5–15)
BUN: 19 mg/dL (ref 6–20)
CO2: 27 mmol/L (ref 22–32)
Calcium: 8.4 mg/dL — ABNORMAL LOW (ref 8.9–10.3)
Chloride: 103 mmol/L (ref 98–111)
Creatinine, Ser: 1.19 mg/dL (ref 0.61–1.24)
GFR calc Af Amer: >60 mL/min (ref >60)
GFR calc non Af Amer: >60 mL/min (ref >60)
Glucose, Bld: 170 mg/dL — ABNORMAL HIGH (ref 70–99)
Potassium: 4 mmol/L (ref 3.5–5.1)
Sodium: 136 mmol/L (ref 135–145)

## 2019-05-30 LAB — CBC
HCT: 40.5 % (ref 39.0–52.0)
Hemoglobin: 13.4 g/dL (ref 13.0–17.0)
MCH: 28.7 pg (ref 26.0–34.0)
MCHC: 33.1 g/dL (ref 30.0–36.0)
MCV: 86.7 fL (ref 80.0–100.0)
Platelets: 163 10*3/uL (ref 150–400)
RBC: 4.67 MIL/uL (ref 4.22–5.81)
RDW: 12.3 % (ref 11.5–15.5)
WBC: 9.3 10*3/uL (ref 4.0–10.5)
nRBC: 0 % (ref 0.0–0.2)

## 2019-05-30 NOTE — Progress Notes (Signed)
Physical Therapy Treatment Patient Details Name: Dennis Whitaker MRN: [id number] DOB: July 25, 1965 Today's Date: 05/30/2019    History of Present Illness Pt is a 54 yo male diagnosed with OA of the L hip and is s/p elective L THA.  PMH includes: DM, HTN, sleep apnea, and GERD.    PT Comments    Pt pleasant and motivated to participate during the session.  Pt with decreased L hip joint pain this session and no dizziness in standing.  Pt somewhat limited by L hip pain with amb but was still able to amb 100' although with slow, effortful, antalgic cadence.  Pt did present with some eccentric control difficulties during stand to sit but improved with practice and cues for proper sequencing.  Pt will benefit from HHPT services upon discharge to safely address deficits listed in patient problem list for decreased caregiver assistance and eventual return to PLOF.     Follow Up Recommendations  Home health PT;Supervision for mobility/OOB     Equipment Recommendations  None recommended by PT    Recommendations for Other Services       Precautions / Restrictions Precautions Precautions: Anterior Hip Precaution Booklet Issued: Yes (comment) Restrictions Weight Bearing Restrictions: Yes LLE Weight Bearing: Weight bearing as tolerated    Mobility  Bed Mobility               General bed mobility comments: NT, pt in recliner  Transfers Overall transfer level: Needs assistance Equipment used: Rolling walker (2 wheeled) Transfers: Sit to/from Stand Sit to Stand: Min guard         General transfer comment: Sit to/from stand transfer training from multiple height surfaces with emphasis on controlled eccentric phase with cues for proper sequencing  Ambulation/Gait   Gait Distance (Feet): 100 Feet x 1, 30 Feet x 1 Assistive device: Rolling walker (2 wheeled) Gait Pattern/deviations: Step-through pattern;Decreased step length - right;Decreased step length - left;Antalgic;Decreased  stance time - left Gait velocity: decreased   General Gait Details: Slow, antalgic cadence with min to mod lean on the RW for support   Stairs             Wheelchair Mobility    Modified Rankin (Stroke Patients Only)       Balance Overall balance assessment: No apparent balance deficits (not formally assessed)                                          Cognition Arousal/Alertness: Awake/alert Behavior During Therapy: WFL for tasks assessed/performed Overall Cognitive Status: Within Functional Limits for tasks assessed                                        Exercises Other Exercises Other Exercises: Hip precaution review and HEP education per handout Other Exercises: Car transfer sequencing training verbally and simulated demonstration with pt and spouse Other Exercises: Stair sequencing verbal education per pt/spouse request    General Comments        Pertinent Vitals/Pain Pain Assessment: 0-10 Pain Score: 3  Pain Location: L hip Pain Descriptors / Indicators: Sore;Aching Pain Intervention(s): Premedicated before session;Monitored during session    Home Living                      Prior Function  PT Goals (current goals can now be found in the care plan section) Progress towards PT goals: Progressing toward goals    Frequency    BID      PT Plan Current plan remains appropriate    Co-evaluation              AM-PAC PT "6 Clicks" Mobility   Outcome Measure  Help needed turning from your back to your side while in a flat bed without using bedrails?: A Little Help needed moving from lying on your back to sitting on the side of a flat bed without using bedrails?: A Little Help needed moving to and from a bed to a chair (including a wheelchair)?: A Little Help needed standing up from a chair using your arms (e.g., wheelchair or bedside chair)?: A Little Help needed to walk in hospital  room?: A Little Help needed climbing 3-5 steps with a railing? : A Little 6 Click Score: 18    End of Session Equipment Utilized During Treatment: Gait belt Activity Tolerance: Patient tolerated treatment well Patient left: with bed alarm set;with call bell/phone within reach;with family/visitor present;Other (comment)(Pt requested to sit at the EOB at end of session, nsg notified) Nurse Communication: Mobility status PT Visit Diagnosis: Other abnormalities of gait and mobility (R26.89);Muscle weakness (generalized) (M62.81);Pain Pain - Right/Left: Left Pain - part of body: Hip     Time: [id number] PT Time Calculation (min) (ACUTE ONLY): 38 min  Charges:  $Gait Training: 8-22 mins $Therapeutic Exercise: 8-22 mins $Therapeutic Activity: 8-22 mins                     D. Scott Paula Zietz PT, DPT 05/30/19, 12:05 PM

## 2019-05-30 NOTE — Progress Notes (Signed)
Subjective:  Patient reports pain as moderate.    Objective:   VITALS:   Vitals:   05/29/19 2357 05/30/19 0412 05/30/19 0731 05/30/19 1223  BP: (!) 144/79 140/71 (!) 155/86 136/81  Pulse: 85 90 93 85  Resp: 17 17 18 17   Temp: 98.3 F (36.8 C) 98.7 F (37.1 C) 99.1 F (37.3 C) 98.7 F (37.1 C)  TempSrc: Oral Oral Oral Oral  SpO2: 96% 97% 98% 99%  Weight:      Height:        PHYSICAL EXAM:  ABD soft Sensation intact distally Dorsiflexion/Plantar flexion intact Incision: dressing C/D/I No cellulitis present Compartment soft  LABS  Results for orders placed or performed during the hospital encounter of 05/29/19 (from the past 24 hour(s))  Glucose, capillary     Status: Abnormal   Collection Time: 05/29/19  2:51 PM  Result Value Ref Range   Glucose-Capillary 161 (H) 70 - 99 mg/dL  Hemoglobin A1c     Status: Abnormal   Collection Time: 05/29/19  4:18 PM  Result Value Ref Range   Hgb A1c MFr Bld 7.5 (H) 4.8 - 5.6 %   Mean Plasma Glucose 168.55 mg/dL  Glucose, capillary     Status: Abnormal   Collection Time: 05/29/19  4:22 PM  Result Value Ref Range   Glucose-Capillary 131 (H) 70 - 99 mg/dL  Glucose, capillary     Status: Abnormal   Collection Time: 05/29/19  9:23 PM  Result Value Ref Range   Glucose-Capillary 183 (H) 70 - 99 mg/dL   Comment 1 Notify RN   CBC     Status: None   Collection Time: 05/30/19  4:23 AM  Result Value Ref Range   WBC 9.3 4.0 - 10.5 K/uL   RBC 4.67 4.22 - 5.81 MIL/uL   Hemoglobin 13.4 13.0 - 17.0 g/dL   HCT 40.5 39.0 - 52.0 %   MCV 86.7 80.0 - 100.0 fL   MCH 28.7 26.0 - 34.0 pg   MCHC 33.1 30.0 - 36.0 g/dL   RDW 12.3 11.5 - 15.5 %   Platelets 163 150 - 400 K/uL   nRBC 0.0 0.0 - 0.2 %  Basic metabolic panel     Status: Abnormal   Collection Time: 05/30/19  4:23 AM  Result Value Ref Range   Sodium 136 135 - 145 mmol/L   Potassium 4.0 3.5 - 5.1 mmol/L   Chloride 103 98 - 111 mmol/L   CO2 27 22 - 32 mmol/L   Glucose, Bld 170 (H)  70 - 99 mg/dL   BUN 19 6 - 20 mg/dL   Creatinine, Ser 1.19 0.61 - 1.24 mg/dL   Calcium 8.4 (L) 8.9 - 10.3 mg/dL   GFR calc non Af Amer >60 >60 mL/min   GFR calc Af Amer >60 >60 mL/min   Anion gap 6 5 - 15  Glucose, capillary     Status: Abnormal   Collection Time: 05/30/19  7:33 AM  Result Value Ref Range   Glucose-Capillary 153 (H) 70 - 99 mg/dL   Comment 1 Notify RN   Glucose, capillary     Status: Abnormal   Collection Time: 05/30/19 11:52 AM  Result Value Ref Range   Glucose-Capillary 196 (H) 70 - 99 mg/dL   Comment 1 Notify RN     DG HIP OPERATIVE UNILAT W OR W/O PELVIS LEFT  Result Date: 05/29/2019 CLINICAL DATA:  Status post total hip arthroplasty on the left EXAM: OPERATIVE LEFT HIP (WITH PELVIS  IF PERFORMED) 1 VIEWS TECHNIQUE: Fluoroscopic spot image(s) were submitted for interpretation post-operatively. FLUOROSCOPY TIME:  0 minutes 8 seconds; 4 acquired images COMPARISON:  None. FINDINGS: Frontal view shows evidence of total hip replacement on the left with prosthetic components well-seated on frontal view. No fracture or dislocation. Visualized right hip joint appears unremarkable. IMPRESSION: Status post total hip replacement on the left with prosthetic components well-seated on frontal view. Visualized right hip unremarkable. No fracture or dislocation. Electronically Signed   By: Lowella Grip III M.D.   On: 05/29/2019 14:21    Assessment/Plan: 1 Day Post-Op   Active Problems:   History of total hip replacement, left   Up with therapy Plan for discharge tomorrow   Lovell Sheehan , MD 05/30/2019, 12:53 PM

## 2019-05-30 NOTE — Progress Notes (Signed)
Physical Therapy Treatment Patient Details Name: Dennis Whitaker MRN: [id number] DOB: Sep 23, 1965 Today's Date: 05/30/2019    History of Present Illness Pt is a 54 yo male diagnosed with OA of the L hip and is s/p elective L THA.  PMH includes: DM, HTN, sleep apnea, and GERD.    PT Comments    Pt pleasant and motivated to participate during the session.  Pt made good progress towards goals this session including increased amb tolerance and quality of gait and was able to ascend and descend steps with only one rail with good control and stability.  Pt's SpO2 and HR were WNL during the session.  Pt continued to put forth very good effort during the session and should continue to make very good progress while in acute care.  Pt will benefit from HHPT services upon discharge to safely address deficits listed in patient problem list for decreased caregiver assistance and eventual return to PLOF. e   Follow Up Recommendations  Home health PT;Supervision for mobility/OOB     Equipment Recommendations  None recommended by PT    Recommendations for Other Services       Precautions / Restrictions Precautions Precautions: Anterior Hip Precaution Booklet Issued: Yes (comment) Restrictions Weight Bearing Restrictions: Yes LLE Weight Bearing: Weight bearing as tolerated    Mobility  Bed Mobility Overal bed mobility: Modified Independent             General bed mobility comments: Extra time and effort only  Transfers Overall transfer level: Needs assistance Equipment used: Rolling walker (2 wheeled) Transfers: Sit to/from Stand Sit to Stand: Min guard         General transfer comment: Sit to/from stand transfer training from multiple height surfaces with emphasis on controlled eccentric phase with cues for proper sequencing  Ambulation/Gait   Gait Distance (Feet): 200 Feet x 1, 125 Feet x 1 Assistive device: Rolling walker (2 wheeled) Gait Pattern/deviations: Step-through  pattern;Decreased step length - right;Decreased step length - left;Antalgic;Decreased stance time - left Gait velocity: decreased   General Gait Details: Grossly improved cadence with increased LLE stance time and good stability   Stairs Stairs: Yes Stairs assistance: Min guard Stair Management: Two rails;One rail Left;Forwards;Step to pattern Number of Stairs: 4 General stair comments: Ascend/descend 1 step with B rails x 4 and then 4 steps with L rail only with mod verbal and visual cues for sequencing; good control and stability throughout   Wheelchair Mobility    Modified Rankin (Stroke Patients Only)       Balance Overall balance assessment: No apparent balance deficits (not formally assessed)                                          Cognition Arousal/Alertness: Awake/alert Behavior During Therapy: WFL for tasks assessed/performed Overall Cognitive Status: Within Functional Limits for tasks assessed                                        Exercises Total Joint Exercises Ankle Circles/Pumps: Strengthening;AROM;Both;10 reps Towel Squeeze: Strengthening;Both;10 reps Hip ABduction/ADduction: AAROM;Strengthening;Left;10 reps Straight Leg Raises: AAROM;Strengthening;Left;10 reps Long Arc Quad: Strengthening;10 reps;15 reps;Left(with manual resistance.) Knee Flexion: Strengthening;Left;10 reps;15 reps(with manual resistance.) Other Exercises Other Exercises: Hip precaution review and HEP education per handout Other Exercises: Car transfer sequencing  training verbally and simulated demonstration with pt and spouse Other Exercises: Stair sequencing verbal education per pt/spouse request    General Comments        Pertinent Vitals/Pain Pain Assessment: 0-10 Pain Score: 4  Pain Location: L hip Pain Descriptors / Indicators: Sore;Aching Pain Intervention(s): Premedicated before session;Monitored during session    Home Living                       Prior Function            PT Goals (current goals can now be found in the care plan section) Progress towards PT goals: Progressing toward goals    Frequency    BID      PT Plan Current plan remains appropriate    Co-evaluation              AM-PAC PT "6 Clicks" Mobility   Outcome Measure  Help needed turning from your back to your side while in a flat bed without using bedrails?: A Little Help needed moving from lying on your back to sitting on the side of a flat bed without using bedrails?: A Little Help needed moving to and from a bed to a chair (including a wheelchair)?: A Little Help needed standing up from a chair using your arms (e.g., wheelchair or bedside chair)?: A Little Help needed to walk in hospital room?: A Little Help needed climbing 3-5 steps with a railing? : A Little 6 Click Score: 18    End of Session Equipment Utilized During Treatment: Gait belt Activity Tolerance: Patient tolerated treatment well Patient left: with bed alarm set;with call bell/phone within reach;with family/visitor present;with SCD's reapplied;in bed Nurse Communication: Mobility status PT Visit Diagnosis: Other abnormalities of gait and mobility (R26.89);Muscle weakness (generalized) (M62.81);Pain Pain - Right/Left: Left Pain - part of body: Hip     Time: [id number] PT Time Calculation (min) (ACUTE ONLY): 42 min  Charges:  $Gait Training: 23-37 mins $Therapeutic Exercise: 8-22 mins $Therapeutic Activity: 8-22 mins                     D. Scott Reason Helzer PT, DPT 05/30/19, 2:31 PM

## 2019-05-30 NOTE — Anesthesia Postprocedure Evaluation (Signed)
Anesthesia Post Note  Patient: Dennis Whitaker  Procedure(s) Performed: TOTAL HIP ARTHROPLASTY ANTERIOR APPROACH (Left Hip)  Patient location during evaluation: Nursing Unit Anesthesia Type: Spinal Level of consciousness: awake, awake and alert, oriented and patient cooperative Pain management: pain level controlled Vital Signs Assessment: post-procedure vital signs reviewed and stable Respiratory status: spontaneous breathing, nonlabored ventilation and respiratory function stable Cardiovascular status: stable Postop Assessment: no headache, no backache, patient able to bend at knees, no apparent nausea or vomiting, able to ambulate and adequate PO intake Anesthetic complications: no     Last Vitals:  Vitals:   05/30/19 0412 05/30/19 0731  BP: 140/71 (!) 155/86  Pulse: 90 93  Resp: 17 18  Temp: 37.1 C 37.3 C  SpO2: 97% 98%    Last Pain:  Vitals:   05/30/19 0731  TempSrc: Oral  PainSc:                  Ricki Miller

## 2019-05-30 NOTE — TOC Transition Note (Signed)
Transition of Care [facility]) - CM/SW Discharge Note   Patient Details  Name: Dennis Whitaker MRN: [id number] Date of Birth: 10-28-1965  Transition [facility] CM/SW Contact:  Su Hilt, RN Phone Number: 05/30/2019, 9:33 AM   Clinical Narrative:     Met with the patient to discuss DC plan and needs He lives at home with his wife and she will provide transportation when needed He has a Rolling walker and BSC at home as well as crutches. He goes to [employer] and can afford his medications He is up to date with his PCP He does not want HH to come and said he has an appointment set up with Outpatient on the 10th, He said he does fine without HH. He does not need additional DME No needs  Final next level of care: Home/Self Care Barriers to Discharge: Barriers Resolved   Patient Goals and CMS Choice Patient states their goals for this hospitalization and ongoing recovery are:: get home      Discharge Placement                       Discharge Plan and Services   Discharge Planning Services: CM Consult            DME Arranged: N/A         HH Arranged: Refused HH          Social Determinants of Health (SDOH) Interventions     Readmission Risk Interventions No flowsheet data found.

## 2019-05-31 DIAGNOSIS — M1612 Unilateral primary osteoarthritis, left hip: Secondary | ICD-10-CM | POA: Diagnosis not present

## 2019-05-31 LAB — SURGICAL PATHOLOGY

## 2019-05-31 LAB — GLUCOSE, CAPILLARY: Glucose-Capillary: 208 mg/dL — ABNORMAL HIGH (ref 70–99)

## 2019-05-31 LAB — CBC
HCT: 40.2 % (ref 39.0–52.0)
Hemoglobin: 13.3 g/dL (ref 13.0–17.0)
MCH: 28.7 pg (ref 26.0–34.0)
MCHC: 33.1 g/dL (ref 30.0–36.0)
MCV: 86.8 fL (ref 80.0–100.0)
Platelets: 158 10*3/uL (ref 150–400)
RBC: 4.63 MIL/uL (ref 4.22–5.81)
RDW: 12.5 % (ref 11.5–15.5)
WBC: 9.4 10*3/uL (ref 4.0–10.5)
nRBC: 0 % (ref 0.0–0.2)

## 2019-05-31 MED ORDER — DOCUSATE SODIUM 100 MG PO CAPS: 100.0000 mg | ORAL_CAPSULE | Freq: Two times a day (BID) | ORAL | 0 refills | Status: AC

## 2019-05-31 MED ORDER — HYDROCODONE-ACETAMINOPHEN 7.5-325 MG PO TABS: 1.0000 | ORAL_TABLET | ORAL | 0 refills | Status: DC | PRN

## 2019-05-31 MED ORDER — ASPIRIN 81 MG PO CHEW: 81.0000 mg | CHEWABLE_TABLET | Freq: Two times a day (BID) | ORAL | 0 refills | Status: DC

## 2019-05-31 MED ORDER — METHOCARBAMOL 500 MG PO TABS: 500.0000 mg | ORAL_TABLET | Freq: Four times a day (QID) | ORAL | 1 refills | Status: DC | PRN

## 2019-05-31 MED ORDER — ONDANSETRON HCL 4 MG PO TABS: 4.0000 mg | ORAL_TABLET | Freq: Four times a day (QID) | ORAL | 0 refills | Status: DC | PRN

## 2019-05-31 NOTE — Progress Notes (Signed)
  Subjective:  Patient reports pain as mild.    Objective:   VITALS:   Vitals:   05/30/19 1223 05/30/19 1544 05/31/19 0029 05/31/19 0722  BP: 136/81 140/76 (!) 147/72 (!) 143/86  Pulse: 85 85 90 95  Resp: 17 17 16 20   Temp: 98.7 F (37.1 C) 99.8 F (37.7 C) (!) 100.5 F (38.1 C) 98.6 F (37 C)  TempSrc: Oral Oral Oral Oral  SpO2: 99% 99% 97% 96%  Weight:      Height:        PHYSICAL EXAM:  Neurologically intact ABD soft Neurovascular intact Sensation intact distally Intact pulses distally Dorsiflexion/Plantar flexion intact Incision: scant drainage No cellulitis present Compartment soft drsg changed  LABS  Results for orders placed or performed during the hospital encounter of 05/29/19 (from the past 24 hour(s))  Glucose, capillary     Status: Abnormal   Collection Time: 05/30/19  4:39 PM  Result Value Ref Range   Glucose-Capillary 135 (H) 70 - 99 mg/dL   Comment 1 Notify RN   Glucose, capillary     Status: Abnormal   Collection Time: 05/30/19  8:52 PM  Result Value Ref Range   Glucose-Capillary 208 (H) 70 - 99 mg/dL   Comment 1 Notify RN   CBC     Status: None   Collection Time: 05/31/19  4:32 AM  Result Value Ref Range   WBC 9.4 4.0 - 10.5 K/uL   RBC 4.63 4.22 - 5.81 MIL/uL   Hemoglobin 13.3 13.0 - 17.0 g/dL   HCT 40.2 39.0 - 52.0 %   MCV 86.8 80.0 - 100.0 fL   MCH 28.7 26.0 - 34.0 pg   MCHC 33.1 30.0 - 36.0 g/dL   RDW 12.5 11.5 - 15.5 %   Platelets 158 150 - 400 K/uL   nRBC 0.0 0.0 - 0.2 %  Glucose, capillary     Status: Abnormal   Collection Time: 05/31/19  7:38 AM  Result Value Ref Range   Glucose-Capillary 208 (H) 70 - 99 mg/dL    DG HIP OPERATIVE UNILAT W OR W/O PELVIS LEFT  Result Date: 05/29/2019 CLINICAL DATA:  Status post total hip arthroplasty on the left EXAM: OPERATIVE LEFT HIP (WITH PELVIS IF PERFORMED) 1 VIEWS TECHNIQUE: Fluoroscopic spot image(s) were submitted for interpretation post-operatively. FLUOROSCOPY TIME:  0 minutes 8  seconds; 4 acquired images COMPARISON:  None. FINDINGS: Frontal view shows evidence of total hip replacement on the left with prosthetic components well-seated on frontal view. No fracture or dislocation. Visualized right hip joint appears unremarkable. IMPRESSION: Status post total hip replacement on the left with prosthetic components well-seated on frontal view. Visualized right hip unremarkable. No fracture or dislocation. Electronically Signed   By: Lowella Grip III M.D.   On: 05/29/2019 14:21    Assessment/Plan: 2 Days Post-Op   Active Problems:   History of total hip replacement, left   Advance diet Up with therapy  Discharge home today   Carlynn Spry , PA-C 05/31/2019, 12:02 PM

## 2019-05-31 NOTE — Progress Notes (Signed)
Physical Therapy Treatment Patient Details Name: Dennis Whitaker MRN: [id number] DOB: 04/05/1965 Today's Date: 05/31/2019    History of Present Illness Pt is a 54 yo male diagnosed with OA of the L hip and is s/p elective L THA.  PMH includes: DM, HTN, sleep apnea, and GERD.    PT Comments    Pt pleasant and motivated to participate during the session.  Pt continued to make good progress towards goals this session.  Pt required no physical assistance during the session and demonstrated good control and stability with transfers, gait, and stair training.  Pt demonstrated very good carryover with sequencing with all functional mobility tasks.  No adverse symptoms noted during the session other than min to mod L hip pain with SpO2 and HR WNL throughout.  Pt will benefit from HHPT services upon discharge to safely address deficits listed in patient problem list for decreased caregiver assistance and eventual return to PLOF.     Follow Up Recommendations  Home health PT;Supervision for mobility/OOB     Equipment Recommendations  None recommended by PT    Recommendations for Other Services       Precautions / Restrictions Precautions Precautions: Anterior Hip Precaution Booklet Issued: Yes (comment) Restrictions Weight Bearing Restrictions: Yes LLE Weight Bearing: Weight bearing as tolerated    Mobility  Bed Mobility Overal bed mobility: Modified Independent             General bed mobility comments: Extra time and effort only  Transfers Overall transfer level: Needs assistance Equipment used: Rolling walker (2 wheeled) Transfers: Sit to/from Stand Sit to Stand: Supervision         General transfer comment: Good carryover regarding proper sequencing with transfers to/from various height surfaces for eccentric control  Ambulation/Gait Ambulation/Gait assistance: Supervision Gait Distance (Feet): 200 Feet Assistive device: Rolling walker (2 wheeled) Gait  Pattern/deviations: Step-through pattern;Decreased step length - right;Decreased step length - left;Antalgic;Decreased stance time - left Gait velocity: decreased   General Gait Details: Slow cadence with antalgic gait pattern that improved as the session progressed with good stability throughout   Stairs Stairs: Yes Stairs assistance: Supervision Stair Management: One rail Left Number of Stairs: 4 General stair comments: Good eccentric and concentric control with no verbal cues needed for proper sequencing   Wheelchair Mobility    Modified Rankin (Stroke Patients Only)       Balance Overall balance assessment: No apparent balance deficits (not formally assessed)                                          Cognition Arousal/Alertness: Awake/alert Behavior During Therapy: WFL for tasks assessed/performed Overall Cognitive Status: Within Functional Limits for tasks assessed                                        Exercises Total Joint Exercises Hip ABduction/ADduction: AAROM;Strengthening;Left;10 reps Straight Leg Raises: AAROM;Strengthening;Left;10 reps Long Arc Quad: Strengthening;10 reps;Left Knee Flexion: Strengthening;Left;10 reps Other Exercises Other Exercises: HEP education/review per handout Other Exercises: Car transfer sequencing review verbally and with simulated demonstration    General Comments        Pertinent Vitals/Pain Pain Assessment: 0-10 Pain Score: 2  Pain Location: L hip Pain Descriptors / Indicators: Sore;Aching Pain Intervention(s): Premedicated before session;Monitored during session  Home Living                      Prior Function            PT Goals (current goals can now be found in the care plan section) Progress towards PT goals: Progressing toward goals    Frequency    BID      PT Plan Current plan remains appropriate    Co-evaluation              AM-PAC PT "6  Clicks" Mobility   Outcome Measure  Help needed turning from your back to your side while in a flat bed without using bedrails?: A Little Help needed moving from lying on your back to sitting on the side of a flat bed without using bedrails?: A Little Help needed moving to and from a bed to a chair (including a wheelchair)?: A Little Help needed standing up from a chair using your arms (e.g., wheelchair or bedside chair)?: A Little Help needed to walk in hospital room?: A Little Help needed climbing 3-5 steps with a railing? : A Little 6 Click Score: 18    End of Session Equipment Utilized During Treatment: Gait belt Activity Tolerance: Patient tolerated treatment well Patient left: with bed alarm set;with call bell/phone within reach;with family/visitor present;with SCD's reapplied;in bed Nurse Communication: Mobility status PT Visit Diagnosis: Other abnormalities of gait and mobility (R26.89);Muscle weakness (generalized) (M62.81);Pain Pain - Right/Left: Left Pain - part of body: Hip     Time: [id number] PT Time Calculation (min) (ACUTE ONLY): 26 min  Charges:  $Gait Training: 8-22 mins $Therapeutic Exercise: 8-22 mins                     D. Scott Chancey Cullinane PT, DPT 05/31/19, 1:06 PM

## 2019-05-31 NOTE — Discharge Instructions (Signed)

## 2019-05-31 NOTE — Discharge Summary (Signed)
Physician Discharge Summary  Patient ID: Dennis Whitaker MRN: [id number] DOB/AGE: [birth year] 54 y.o.  Admit date: 05/29/2019 Discharge date: 05/31/2019  Admission Diagnoses:  M16.12 Unilateral primary osteoarthritis, left hip <principal problem not specified>  Discharge Diagnoses:  M16.12 Unilateral primary osteoarthritis, left hip Active Problems:   History of total hip replacement, left   Past Medical History:  Diagnosis Date  . Asthma   . Diabetes mellitus without complication ([place])   . GERD (gastroesophageal reflux disease)   . Hx of pancreatitis 2015  . Hypertension   . Sleep apnea     Surgeries: Procedure(s): TOTAL HIP ARTHROPLASTY ANTERIOR APPROACH on 05/29/2019   Consultants (if any):   Discharged Condition: Improved  Hospital Course: Dennis Whitaker is an 54 y.o. male who was admitted 05/29/2019 with a diagnosis of  M16.12 Unilateral primary osteoarthritis, left hip <principal problem not specified> and went to the operating room on 05/29/2019 and underwent the above named procedures.    He was given perioperative antibiotics:  Anti-infectives (From admission, onward)   Start     Dose/Rate Route Frequency Ordered Stop   05/29/19 1800  ceFAZolin (ANCEF) IVPB 2g/100 mL premix     2 g 200 mL/hr over 30 Minutes Intravenous Every 6 hours 05/29/19 1601 05/29/19 2357   05/29/19 1445  50,000 units bacitracin in 0.9% normal saline 250 mL irrigation  Status:  Discontinued       As needed 05/29/19 1446 05/29/19 1446   05/29/19 1015  ceFAZolin (ANCEF) IVPB 2g/100 mL premix     2 g 200 mL/hr over 30 Minutes Intravenous On call to O.R. 05/29/19 1000 05/29/19 1300    .  He was given sequential compression devices, early ambulation, and Aspirin for DVT prophylaxis.  He benefited maximally from the hospital stay and there were no complications.    Recent vital signs:  Vitals:   05/31/19 0029 05/31/19 0722  BP: (!) 147/72 (!) 143/86  Pulse: 90 95  Resp: 16 20  Temp: (!) 100.5 F  (38.1 C) 98.6 F (37 C)  SpO2: 97% 96%    Recent laboratory studies:  Lab Results  Component Value Date   HGB 13.3 05/31/2019   HGB 13.4 05/30/2019   HGB 16.6 08/25/2018   Lab Results  Component Value Date   WBC 9.4 05/31/2019   PLT 158 05/31/2019   Lab Results  Component Value Date   INR 1.0 05/25/2019   Lab Results  Component Value Date   NA 136 05/30/2019   K 4.0 05/30/2019   CL 103 05/30/2019   CO2 27 05/30/2019   BUN 19 05/30/2019   CREATININE 1.19 05/30/2019   GLUCOSE 170 (H) 05/30/2019    Discharge Medications:   Allergies as of 05/31/2019   No Known Allergies     Medication List    STOP taking these medications   aspirin EC 81 MG tablet Replaced by: aspirin 81 MG chewable tablet   meloxicam 15 MG tablet Commonly known as: MOBIC   traMADol 50 MG tablet Commonly known as: ULTRAM     TAKE these medications   albuterol 108 (90 Base) MCG/ACT inhaler Commonly known as: VENTOLIN HFA Inhale 2 puffs into the lungs every 6 (six) hours as needed for wheezing or shortness of breath.   amLODipine 10 MG tablet Commonly known as: NORVASC Take 10 mg by mouth daily.   ARNICARE EX Apply 1 application topically daily as needed (pain).   aspirin 81 MG chewable tablet Chew 1 tablet (81 mg  total) by mouth 2 (two) times daily. Replaces: aspirin EC 81 MG tablet   atorvastatin 20 MG tablet Commonly known as: LIPITOR Take 20 mg by mouth daily.   bisoprolol-hydrochlorothiazide 5-6.25 MG tablet Commonly known as: ZIAC Take 1 tablet by mouth daily.   docusate sodium 100 MG capsule Commonly known as: COLACE Take 1 capsule (100 mg total) by mouth 2 (two) times daily.   glipiZIDE 5 MG tablet Commonly known as: GLUCOTROL Take 5 mg by mouth daily.   HYDROcodone-acetaminophen 7.5-325 MG tablet Commonly known as: NORCO Take 1 tablet by mouth every 4 (four) hours as needed for severe pain (pain).   lisinopril 40 MG tablet Commonly known as: ZESTRIL Take 40 mg  by mouth daily.   metFORMIN 1000 MG tablet Commonly known as: GLUCOPHAGE Take 1,000 mg by mouth 2 (two) times daily with a meal.   methocarbamol 500 MG tablet Commonly known as: ROBAXIN Take 1 tablet (500 mg total) by mouth every 6 (six) hours as needed for muscle spasms.   ondansetron 4 MG tablet Commonly known as: ZOFRAN Take 1 tablet (4 mg total) by mouth every 6 (six) hours as needed for nausea.   PREVACID PO Take 1 capsule by mouth daily.   sodium chloride 0.65 % Soln nasal spray Commonly known as: OCEAN Place 1 spray into both nostrils as needed for congestion.   sucralfate 1 g tablet Commonly known as: CARAFATE Take 1 tablet (1 g total) by mouth 4 (four) times daily -  with meals and at bedtime.   Vitamin D-3 25 MCG (1000 UT) Caps Take 1,000 Units by mouth daily.   Voltaren 1 % Gel Generic drug: diclofenac Sodium Apply 2 g topically 3 (three) times daily as needed (pain).            Durable Medical Equipment  (From admission, onward)         Start     Ordered   05/31/19 1208  For home use only DME 3 n 1  Once     05/31/19 1207   05/31/19 1208  For home use only DME Walker rolling  Once    Question Answer Comment  Walker: With 5 Inch Wheels   Patient needs a walker to treat with the following condition Osteoarthritis of left hip      05/31/19 1207   05/29/19 1602  DME Walker rolling  Once    Question:  Patient needs a walker to treat with the following condition  Answer:  History of total hip replacement, left   05/29/19 1601   05/29/19 1602  DME 3 n 1  Once     05/29/19 1601   05/29/19 1602  DME Bedside commode  Once    Question:  Patient needs a bedside commode to treat with the following condition  Answer:  History of total hip replacement, left   05/29/19 1601          Diagnostic Studies: DG HIP OPERATIVE UNILAT W OR W/O PELVIS LEFT  Result Date: 05/29/2019 CLINICAL DATA:  Status post total hip arthroplasty on the left EXAM: OPERATIVE LEFT  HIP (WITH PELVIS IF PERFORMED) 1 VIEWS TECHNIQUE: Fluoroscopic spot image(s) were submitted for interpretation post-operatively. FLUOROSCOPY TIME:  0 minutes 8 seconds; 4 acquired images COMPARISON:  None. FINDINGS: Frontal view shows evidence of total hip replacement on the left with prosthetic components well-seated on frontal view. No fracture or dislocation. Visualized right hip joint appears unremarkable. IMPRESSION: Status post total hip replacement on the  left with prosthetic components well-seated on frontal view. Visualized right hip unremarkable. No fracture or dislocation. Electronically Signed   By: Lowella Grip III M.D.   On: 05/29/2019 14:21    Disposition: Discharge disposition: 01-Home or Self Care            Signed: Carlynn Spry ,PA-C 05/31/2019, 12:08 PM

## 2019-06-01 LAB — GLUCOSE, CAPILLARY: Glucose-Capillary: 188 mg/dL — ABNORMAL HIGH (ref 70–99)

## 2019-06-05 ENCOUNTER — Ambulatory Visit: Payer: BC Managed Care – PPO | Admitting: Gastroenterology

## 2019-06-19 NOTE — Addendum Note (Signed)
Addendum  created 06/19/19 1029 by Tera Mater, MD   Attestation recorded in [place], Home filed

## 2019-11-29 DIAGNOSIS — Z96659 Presence of unspecified artificial knee joint: Secondary | ICD-10-CM | POA: Insufficient documentation

## 2020-02-04 ENCOUNTER — Other Ambulatory Visit: Payer: BC Managed Care – PPO

## 2020-02-04 DIAGNOSIS — Z20822 Contact with and (suspected) exposure to covid-19: Secondary | ICD-10-CM

## 2020-02-06 ENCOUNTER — Ambulatory Visit: Payer: Self-pay | Admitting: *Deleted

## 2020-02-06 LAB — NOVEL CORONAVIRUS, NAA: SARS-CoV-2, NAA: DETECTED — AB

## 2020-02-06 LAB — SARS-COV-2, NAA 2 DAY TAT

## 2020-02-06 NOTE — Telephone Encounter (Signed)
Pt's wife called in requesting his covid result.   It has not been processed.

## 2020-06-17 ENCOUNTER — Ambulatory Visit (INDEPENDENT_AMBULATORY_CARE_PROVIDER_SITE_OTHER): Payer: BC Managed Care – PPO

## 2020-06-17 ENCOUNTER — Other Ambulatory Visit: Payer: Self-pay

## 2020-06-17 ENCOUNTER — Ambulatory Visit: Payer: BC Managed Care – PPO | Admitting: Podiatry

## 2020-06-17 ENCOUNTER — Encounter: Payer: Self-pay | Admitting: Podiatry

## 2020-06-17 DIAGNOSIS — M2042 Other hammer toe(s) (acquired), left foot: Secondary | ICD-10-CM

## 2020-06-17 DIAGNOSIS — Q828 Other specified congenital malformations of skin: Secondary | ICD-10-CM | POA: Diagnosis not present

## 2020-06-17 DIAGNOSIS — M2041 Other hammer toe(s) (acquired), right foot: Secondary | ICD-10-CM | POA: Diagnosis not present

## 2020-06-17 DIAGNOSIS — L603 Nail dystrophy: Secondary | ICD-10-CM | POA: Diagnosis not present

## 2020-06-17 NOTE — Progress Notes (Signed)
Subjective:  Patient ID: Dennis Whitaker, male    DOB: 11/17/1965,  MRN: [id number] HPI Chief Complaint  Patient presents with  . Toe Pain    3rd toes bilateral (L>R) - aching x several months, noticed they are turned, getting callused on tip (especially 3rd left), tries to keep nail trimmed back and callus but bleeds, 2nd toe right - aching some   . FYI    Scheduled for hip replacement in July  . Diabetes  . New Patient (Initial Visit)    55 y.o. male presents with the above complaint.   ROS: Denies fever chills nausea vomiting muscle aches pains calf pain back pain chest pain shortness of breath.  Has had left hip surgery will be going in July for right hip surgery  Past Medical History:  Diagnosis Date  . Asthma   . Diabetes mellitus without complication ([place])   . GERD (gastroesophageal reflux disease)   . Hx of pancreatitis 2015  . Hypertension   . Sleep apnea    Past Surgical History:  Procedure Laterality Date  . CHOLECYSTECTOMY    . CHOLECYSTECTOMY, LAPAROSCOPIC  2015  . COLONOSCOPY  01/2017   normal per pt  . ESOPHAGOGASTRODUODENOSCOPY  2015  . ESOPHAGOGASTRODUODENOSCOPY (EGD) WITH PROPOFOL N/A 03/17/2019   Procedure: ESOPHAGOGASTRODUODENOSCOPY (EGD) WITH PROPOFOL;  Surgeon: Jonathon Bellows, MD;  Location: [facility] ENDOSCOPY;  Service: Gastroenterology;  Laterality: N/A;  . KNEE ARTHROSCOPY Right    x 2  . TONSILLECTOMY    . TOTAL HIP ARTHROPLASTY Left 05/29/2019   Procedure: TOTAL HIP ARTHROPLASTY ANTERIOR APPROACH;  Surgeon: Lovell Sheehan, MD;  Location: [facility] ORS;  Service: Orthopedics;  Laterality: Left;    Current Outpatient Medications:  .  albuterol (VENTOLIN HFA) 108 (90 Base) MCG/ACT inhaler, Inhale 2 puffs into the lungs every 6 (six) hours as needed for wheezing or shortness of breath. , Disp: , Rfl:  .  amLODipine (NORVASC) 10 MG tablet, Take 10 mg by mouth daily., Disp: , Rfl:  .  atorvastatin (LIPITOR) 20 MG tablet, Take 20 mg by mouth daily., Disp: , Rfl:   .  B-D UF III MINI PEN NEEDLES 31G X 5 MM MISC, SMARTSIG:1 Each SUB-Q Daily, Disp: , Rfl:  .  bisoprolol-hydrochlorothiazide (ZIAC) 5-6.25 MG tablet, Take 1 tablet by mouth daily., Disp: , Rfl:  .  Cholecalciferol (VITAMIN D-3) 25 MCG (1000 UT) CAPS, Take 1,000 Units by mouth daily. , Disp: , Rfl:  .  docusate sodium (COLACE) 100 MG capsule, Take 1 capsule (100 mg total) by mouth 2 (two) times daily., Disp: 30 capsule, Rfl: 0 .  glipiZIDE (GLUCOTROL) 5 MG tablet, Take 5 mg by mouth daily., Disp: , Rfl:  .  Lansoprazole (PREVACID PO), Take 1 capsule by mouth daily., Disp: , Rfl:  .  lisinopril (ZESTRIL) 40 MG tablet, Take 40 mg by mouth daily. , Disp: , Rfl:  .  metFORMIN (GLUCOPHAGE) 1000 MG tablet, Take 1,000 mg by mouth 2 (two) times daily with a meal. , Disp: , Rfl:  .  NOVOLIN N FLEXPEN 100 UNIT/ML Kiwkpen, Inject into the skin., Disp: , Rfl:  .  sodium chloride (OCEAN) 0.65 % SOLN nasal spray, Place 1 spray into both nostrils as needed for congestion., Disp: , Rfl:  .  traMADol (ULTRAM) 50 MG tablet, Take 50 mg by mouth every 6 (six) hours as needed., Disp: , Rfl:  .  triamterene-hydrochlorothiazide (DYAZIDE) 37.5-25 MG capsule, Take 1 capsule by mouth daily., Disp: , Rfl:   No  Known Allergies Review of Systems Objective:  There were no vitals filed for this visit.  General: Well developed, nourished, in no acute distress, alert and oriented x3   Dermatological: Skin is warm, dry and supple bilateral. Nails x 10 are well maintained; remaining integument appears unremarkable at this time other than the small reactive hyperkeratotic lesion to the distal lateral aspect third digit left foot no open lesions or wounds.. There are no open sores, no preulcerative lesions, no rash or signs of infection present.  Nails to toes #2 #3 look dystrophic possibly mycotic  Vascular: Dorsalis Pedis artery and Posterior Tibial artery pedal pulses are 2/4 bilateral with immedate capillary fill time. Pedal  hair growth present. No varicosities and no lower extremity edema present bilateral.   Neruologic: Grossly intact via light touch bilateral. Vibratory intact via tuning fork bilateral. Protective threshold with Semmes Wienstein monofilament intact to all pedal sites bilateral. Patellar and Achilles deep tendon reflexes 2+ bilateral. No Babinski or clonus noted bilateral.   Musculoskeletal: No gross boney pedal deformities bilateral. No pain, crepitus, or limitation noted with foot and ankle range of motion bilateral. Muscular strength 5/5 in all groups tested bilateral.  Gait: Unassisted, Nonantalgic.    Radiographs:  Radiographs taken today demonstrate osseously mature individual osteoarthritic changes to the toes adductovarus rotated hammertoe deformity fourth and third bilateral.  Assessment & Plan:   Assessment: Diabetes with diabetic peripheral neuropathy hammertoe deformities resulting in reactive hyperkeratotic lesion distal lateral aspect of the third toe left foot and osteoarthritis to these toes.  Nail dystrophy second third toes bilateral Plan: Debridement of reactive hyperkeratotic tissue.  Debridement of dystrophic nails second and third toes bilateral placed silicone padding underneath the toes.  I will follow-up with him in 1 month for pathology evaluation     Myan Locatelli T. [place], [place]

## 2020-07-19 ENCOUNTER — Telehealth: Payer: Self-pay | Admitting: Podiatry

## 2020-07-19 NOTE — Telephone Encounter (Signed)
Pt left message stating he got these things (possible orthotics) at his last visit for his hammertoes and they are wearing out.  I returned call and vm was full but pt called right back and he was talking about butress pads and I told him we do sell them in the office and he could stop by and purchase them. They are 10.00 each. He said thank you for helping him.

## 2020-07-31 ENCOUNTER — Ambulatory Visit: Payer: BC Managed Care – PPO | Admitting: Podiatry

## 2020-07-31 ENCOUNTER — Encounter: Payer: Self-pay | Admitting: Podiatry

## 2020-07-31 ENCOUNTER — Other Ambulatory Visit: Payer: Self-pay

## 2020-07-31 DIAGNOSIS — L603 Nail dystrophy: Secondary | ICD-10-CM | POA: Diagnosis not present

## 2020-07-31 MED ORDER — TERBINAFINE HCL 250 MG PO TABS: 250.0000 mg | ORAL_TABLET | Freq: Every day | ORAL | 0 refills | Status: DC

## 2020-07-31 NOTE — Progress Notes (Signed)
He presents today for follow-up of his painful mycotic nails and the pathology that we took for them.  Objective: Vital signs are stable he is alert oriented x3.  Hammertoe deformities noted bilateral pathology report does demonstrate onychomycosis.  Plan: Discussed etiology pathology conservative surgical therapies at this point we will discuss surgical intervention regarding his him hammertoe deformities and eczema centimeters.  We are also going to go ahead and get him started on his first 30 days of Lamisil 1 tablet p.o. daily and we will request blood work.  I will follow-up with him in 1 month to discuss hammertoe repair after his right hip surgery.  We will also discussed Lamisil at that time.

## 2020-09-04 ENCOUNTER — Other Ambulatory Visit: Payer: Self-pay

## 2020-09-04 ENCOUNTER — Encounter: Payer: Self-pay | Admitting: Podiatry

## 2020-09-04 ENCOUNTER — Ambulatory Visit (INDEPENDENT_AMBULATORY_CARE_PROVIDER_SITE_OTHER): Payer: BC Managed Care – PPO | Admitting: Podiatry

## 2020-09-04 DIAGNOSIS — L603 Nail dystrophy: Secondary | ICD-10-CM

## 2020-09-04 DIAGNOSIS — D2372 Other benign neoplasm of skin of left lower limb, including hip: Secondary | ICD-10-CM

## 2020-09-04 NOTE — Progress Notes (Signed)
He presents today states that he just started taking his Lamisil again he says have only taken 3 days so far because I just had my hip surgery done.  He would like to consider holding off on his toes until after his hip is healed to some degree.  Objective: Vital signs stable alert oriented x3.  There is no erythema edema/drainage or odor.  His nails have not demonstrated any type of normal outgrowth as of yet since is only been on it for 3 days.  But he still has hammertoes on the left foot #2 #3 #4.  Assessment: Hammertoes #2 #3 #4 the left foot.  Onychomycosis bilateral.  Plan: He will continue the use of Lamisil daily and I will follow-up with him in 1 month.  In 1 month we will discuss a surgical consult consisting of hammertoe repair with pins toes #2 #3 #4 the left foot

## 2020-09-25 ENCOUNTER — Ambulatory Visit: Payer: BC Managed Care – PPO | Admitting: Podiatry

## 2020-10-02 ENCOUNTER — Encounter: Payer: Self-pay | Admitting: Podiatry

## 2020-10-02 ENCOUNTER — Other Ambulatory Visit: Payer: Self-pay

## 2020-10-02 ENCOUNTER — Ambulatory Visit (INDEPENDENT_AMBULATORY_CARE_PROVIDER_SITE_OTHER): Payer: BC Managed Care – PPO | Admitting: Podiatry

## 2020-10-02 DIAGNOSIS — L603 Nail dystrophy: Secondary | ICD-10-CM

## 2020-10-02 DIAGNOSIS — M2042 Other hammer toe(s) (acquired), left foot: Secondary | ICD-10-CM | POA: Diagnosis not present

## 2020-10-02 DIAGNOSIS — D2372 Other benign neoplasm of skin of left lower limb, including hip: Secondary | ICD-10-CM

## 2020-10-02 MED ORDER — TERBINAFINE HCL 250 MG PO TABS: 250.0000 mg | ORAL_TABLET | Freq: Every day | ORAL | 0 refills | Status: AC

## 2020-10-02 NOTE — Progress Notes (Signed)
He presents today for follow-up of his Lamisil therapy and down for surgical consult on his toes #3 #4 of his left foot.  He states that he has not been taking his Lamisil regularly because of the GI upset that he gets with it.  He would like to discuss hammertoe repair left foot.  Objective: Vital signs are stable he is alert and oriented x3.  Pulses are palpable.  It appears that the majority of his hammertoe is a rotated mallet toe deformity which can be fixed with distal interphalangeal joint fusions and pins.  Assessment hammertoe deformity/mallet toe deformities #3 #4 of the left foot.  Plan: Discussed etiology pathology conservative surgical therapies this point time went him performed a consent form regarding hammertoe repair #3 #4.  Answer all the questions the best my ability layman's terms demonstrate was amenable to a we discussed the possible postop complications which may include but not limited to postop pain bleeding swelling infection recurrence need for further surgery overcorrection under correction loss of digit loss of limb loss of life.  Also provided him with information regarding the surgery center and anesthesia group should he have questions or concerns he will notify us immediately.

## 2020-10-07 ENCOUNTER — Ambulatory Visit: Payer: BC Managed Care – PPO | Admitting: Podiatry

## 2020-10-16 ENCOUNTER — Ambulatory Visit: Payer: BC Managed Care – PPO | Admitting: Podiatry

## 2020-10-16 ENCOUNTER — Telehealth: Payer: Self-pay | Admitting: Urology

## 2020-10-16 NOTE — Telephone Encounter (Signed)
DOS - 11/15/20  MALLET TOE 3,4 LEFT --- 32003   [employer] EFFECTIVE DATE - 09/26/20   PLAN DEDUCTIBLE - $1,250.00 W/ $0.00 REMAINING OUT OF POCKET - $4,890.00 W/ $0.00 REMAINING COINSURANCE - 20% COPAY - $0.00   NO PRIOR AUTH IS REQUIRED

## 2020-10-29 ENCOUNTER — Other Ambulatory Visit: Payer: Self-pay | Admitting: Podiatry

## 2020-11-13 ENCOUNTER — Other Ambulatory Visit: Payer: Self-pay | Admitting: Podiatry

## 2020-11-13 MED ORDER — OXYCODONE-ACETAMINOPHEN 10-325 MG PO TABS: 1.0000 | ORAL_TABLET | Freq: Three times a day (TID) | ORAL | 0 refills | Status: AC | PRN

## 2020-11-13 MED ORDER — CEPHALEXIN 500 MG PO CAPS: 500.0000 mg | ORAL_CAPSULE | Freq: Three times a day (TID) | ORAL | 0 refills | Status: DC

## 2020-11-13 MED ORDER — ONDANSETRON HCL 4 MG PO TABS: 4.0000 mg | ORAL_TABLET | Freq: Three times a day (TID) | ORAL | 0 refills | Status: AC | PRN

## 2020-11-15 DIAGNOSIS — M2042 Other hammer toe(s) (acquired), left foot: Secondary | ICD-10-CM

## 2020-11-16 ENCOUNTER — Other Ambulatory Visit: Payer: Self-pay | Admitting: Podiatry

## 2020-11-16 MED ORDER — CLINDAMYCIN HCL 300 MG PO CAPS: 300.0000 mg | ORAL_CAPSULE | Freq: Three times a day (TID) | ORAL | 0 refills | Status: AC

## 2020-11-20 ENCOUNTER — Ambulatory Visit (INDEPENDENT_AMBULATORY_CARE_PROVIDER_SITE_OTHER): Payer: BC Managed Care – PPO

## 2020-11-20 ENCOUNTER — Other Ambulatory Visit: Payer: Self-pay

## 2020-11-20 ENCOUNTER — Encounter: Payer: Self-pay | Admitting: Podiatry

## 2020-11-20 ENCOUNTER — Ambulatory Visit (INDEPENDENT_AMBULATORY_CARE_PROVIDER_SITE_OTHER): Payer: BC Managed Care – PPO | Admitting: Podiatry

## 2020-11-20 DIAGNOSIS — M2042 Other hammer toe(s) (acquired), left foot: Secondary | ICD-10-CM

## 2020-11-20 DIAGNOSIS — Z9889 Other specified postprocedural states: Secondary | ICD-10-CM

## 2020-11-20 NOTE — Progress Notes (Signed)
He presents today for his first postop visit he is status post hammertoe repair with pin fixation third and fourth toes of the left foot.  He states that is doing okay.  Objective: Vital signs are stable he is alert and orient x3 dressed her dressing intact reviewed and reveals no erythema just mild edema no cellulitis drainage or odor sutures are intact margins appear to be well coapted.  Radiographs taken today demonstrate well-placed K wires toes are rectus.  Assessment well-healing surgical toes x1 week.  Plan: Redressed today dressed a compressive dressing follow-up with him in 1 week for suture removal.

## 2020-11-27 ENCOUNTER — Encounter: Payer: Self-pay | Admitting: Podiatry

## 2020-11-27 ENCOUNTER — Ambulatory Visit (INDEPENDENT_AMBULATORY_CARE_PROVIDER_SITE_OTHER): Payer: BC Managed Care – PPO | Admitting: Podiatry

## 2020-11-27 ENCOUNTER — Other Ambulatory Visit: Payer: Self-pay

## 2020-11-27 DIAGNOSIS — M2042 Other hammer toe(s) (acquired), left foot: Secondary | ICD-10-CM

## 2020-11-27 DIAGNOSIS — Z9889 Other specified postprocedural states: Secondary | ICD-10-CM

## 2020-11-27 MED ORDER — OXYCODONE-ACETAMINOPHEN 10-325 MG PO TABS: 1.0000 | ORAL_TABLET | Freq: Three times a day (TID) | ORAL | 0 refills | Status: AC | PRN

## 2020-11-27 NOTE — Progress Notes (Signed)
He presents today for his 2nd postop visit he is status post hammertoe repair with pin fixation third and fourth toes of the left foot, date of surgery 11/15/2020.  He states that he has some twinges in the toes and a hard time at night cause he rolls around at night. He usually takes his pain meds at night time only. He is down to his last pill and is requesting a refill.   Patient is also taking lamisil, about 6 weeks worth at this time, and states he tried taking it every other day due to GI discomfort, but it has persisted and he has now discontinued it altogether.   Objective: Vital signs are stable he is alert and orient x3 dressing intact, reveals no erythema just mild edema no cellulitis drainage or odor sutures are intact margins appear to be well coapted.  Sutures removed today and incision edges stayed intact. Foot was dressing with a light DSD, with instructions to keep on and dry for one week. I dispensed a surgical shoe and a roll of 1" coban and demonstrated how to lightly wrap the toes during week two. Instructed that he could get foot shower wet, no soaking, during week two.  Assessment: well-healing surgical toes x 2 weeks  Plan: He will follow up with Dr. Milinda Pointer in 2 weeks for re-xray. Dr. Sherryle Lis will refill his pain medication for him.

## 2020-12-02 ENCOUNTER — Ambulatory Visit: Payer: BC Managed Care – PPO | Admitting: Podiatry

## 2020-12-11 ENCOUNTER — Ambulatory Visit (INDEPENDENT_AMBULATORY_CARE_PROVIDER_SITE_OTHER): Payer: BC Managed Care – PPO

## 2020-12-11 ENCOUNTER — Ambulatory Visit (INDEPENDENT_AMBULATORY_CARE_PROVIDER_SITE_OTHER): Payer: BC Managed Care – PPO | Admitting: Podiatry

## 2020-12-11 ENCOUNTER — Other Ambulatory Visit: Payer: Self-pay

## 2020-12-11 ENCOUNTER — Encounter: Payer: Self-pay | Admitting: Podiatry

## 2020-12-11 DIAGNOSIS — M2042 Other hammer toe(s) (acquired), left foot: Secondary | ICD-10-CM

## 2020-12-11 DIAGNOSIS — Z9889 Other specified postprocedural states: Secondary | ICD-10-CM

## 2020-12-11 NOTE — Progress Notes (Signed)
He presents today date of surgery is 11/16/2018 came out to repair #3 #4 of his left foot.  States that they are feeling pretty good he states that the fourth hand seems to be pushing on the ground which makes his toe little sore.  States that he bumped his toe the other day when he was out in the building preparing for a gathering they were having.  Objective: Vital signs are stable alert oriented x3 K wires to the third and fourth toes of the left foot are intact do not appear to be retrograding the toes are minimally edematous no erythema cellulitis drainage or odor see no pin tract infection.  Radiographs taken today demonstrate osseously mature foot do not demonstrate any type of arthrodesis across the DIPJ's as of yet.  Assessment: Well-healing surgical toes.  Plan: Redressed today dressed a compressive dressing and also demonstrated to him how to place the pad under the fourth toe to keep the pin from rubbing.  He understands this is amendable to it we will follow-up with me in 2 weeks hopefully we will be able to pull the pins at that time x-rays will be necessary.

## 2020-12-25 ENCOUNTER — Telehealth: Payer: Self-pay | Admitting: *Deleted

## 2020-12-25 ENCOUNTER — Ambulatory Visit (INDEPENDENT_AMBULATORY_CARE_PROVIDER_SITE_OTHER): Payer: BC Managed Care – PPO | Admitting: Podiatry

## 2020-12-25 ENCOUNTER — Encounter: Payer: Self-pay | Admitting: Podiatry

## 2020-12-25 ENCOUNTER — Ambulatory Visit (INDEPENDENT_AMBULATORY_CARE_PROVIDER_SITE_OTHER): Payer: BC Managed Care – PPO

## 2020-12-25 ENCOUNTER — Other Ambulatory Visit: Payer: Self-pay

## 2020-12-25 ENCOUNTER — Ambulatory Visit: Payer: BC Managed Care – PPO

## 2020-12-25 ENCOUNTER — Encounter: Payer: BC Managed Care – PPO | Admitting: Podiatry

## 2020-12-25 DIAGNOSIS — M2042 Other hammer toe(s) (acquired), left foot: Secondary | ICD-10-CM

## 2020-12-25 NOTE — Telephone Encounter (Signed)
Called patient- informed him of Dr. Stephenie Acres response. Patient verbalized understanding.

## 2020-12-25 NOTE — Progress Notes (Signed)
He presents today for his 6-week follow-up date of surgery 11/15/2020 mallet toe repair #3 #4 of his left foot with K wires.  He denies fever chills nausea vomiting muscle aches and pains states that he feels some pinching sometimes.  Objective: Vital signs are stable alert and oriented x3.  Pulses are palpable.  There is no erythema edema cellulitis drainage or other K wires are in place.  Radiographs taken today demonstrate healing PIPJ's with fusions.  Assessment well-healing surgical toes 3 and 4 the left foot.  Plan: Removed K wires today demonstrated to him how to tape the toes on a daily basis he will continue to do so for about the next week or so wearing the [place] shoe and that he will try regular shoe gear.  Follow-up with him in 2 to 3 weeks

## 2020-12-25 NOTE — Telephone Encounter (Signed)
L/M on VM - had pins taken out of toes this morning, the pain has worsened since he was here. Please advise

## 2021-01-01 DIAGNOSIS — M79676 Pain in unspecified toe(s): Secondary | ICD-10-CM

## 2021-01-08 ENCOUNTER — Encounter: Payer: Self-pay | Admitting: Podiatry

## 2021-01-08 ENCOUNTER — Other Ambulatory Visit: Payer: Self-pay

## 2021-01-08 ENCOUNTER — Ambulatory Visit (INDEPENDENT_AMBULATORY_CARE_PROVIDER_SITE_OTHER): Payer: BC Managed Care – PPO

## 2021-01-08 ENCOUNTER — Ambulatory Visit (INDEPENDENT_AMBULATORY_CARE_PROVIDER_SITE_OTHER): Payer: BC Managed Care – PPO | Admitting: Podiatry

## 2021-01-08 DIAGNOSIS — M2042 Other hammer toe(s) (acquired), left foot: Secondary | ICD-10-CM

## 2021-01-08 DIAGNOSIS — Z9889 Other specified postprocedural states: Secondary | ICD-10-CM

## 2021-01-08 NOTE — Progress Notes (Signed)
She presents today he presents today for follow-up of his left foot.  Mallet toe repairs #3 #4 the left foot.  States that toes are still sensitive they are bruised colored he is concerned about the swelling.  States that he wore the dressing for about a week as he refers to the compression wrap that we had discussed after removing his pants.  Objective: Vital signs are stable he is alert oriented x3 there is no erythema to some mild edema to the distal aspect of toes #3 #4 he has small laceration dorsal aspect of the foot where scissors dropped on his foot.  He has a small area of abrasion from trying to trim his toenails to the second toe of his left foot.  I see no other abrasions or wounds.  His toes that were surgically corrected appear to be doing well there was a small distal clavus which I did debride for him today.  Also debrided the painful callus to the plantar aspect of the fifth met head.  Assessment: Well-healing surgical foot.  Plan: Encouraged him to continue to wrap the toes but get back into his regular tennis shoes I like to follow-up with him in a few weeks, approximately 1 month

## 2021-02-10 ENCOUNTER — Ambulatory Visit (INDEPENDENT_AMBULATORY_CARE_PROVIDER_SITE_OTHER): Payer: BC Managed Care – PPO | Admitting: Podiatry

## 2021-02-10 ENCOUNTER — Ambulatory Visit (INDEPENDENT_AMBULATORY_CARE_PROVIDER_SITE_OTHER): Payer: BC Managed Care – PPO

## 2021-02-10 ENCOUNTER — Encounter: Payer: Self-pay | Admitting: Podiatry

## 2021-02-10 ENCOUNTER — Other Ambulatory Visit: Payer: Self-pay

## 2021-02-10 DIAGNOSIS — M2042 Other hammer toe(s) (acquired), left foot: Secondary | ICD-10-CM

## 2021-02-10 DIAGNOSIS — Z9889 Other specified postprocedural states: Secondary | ICD-10-CM

## 2021-02-10 NOTE — Progress Notes (Signed)
He presents today date of surgery 11/15/2020 mallet toe repair #3 #4 the left foot states that there is still some swelling around the third toe but it feels kind of weird because it does not bend but all in all it is much better than it was prior to surgery.  Objective: Vital signs are stable he is alert and oriented x3.  Pulses are palpable.  Mildly edematous third toe there is rectus in nature.  Radiographs taken today demonstrate an osseously mature individual with good fusion across the PIPJ's of the third and fourth toes of the left foot.  Assessment: Well-healing surgical toes.  Plan: Continue to dress the third toe as needed for swelling otherwise follow-up with me on an as-needed basis.

## 2022-03-02 IMAGING — RF DG HIP (WITH PELVIS) OPERATIVE*L*
1 series · 4 of 4 positions shown · non-contrast
Comparison: None.

CLINICAL DATA: Status post total hip arthroplasty on the left

EXAM:
OPERATIVE LEFT HIP (WITH PELVIS IF PERFORMED) 1 VIEWS
TECHNIQUE: Fluoroscopic spot image(s) were submitted for interpretation
post-operatively.
FLUOROSCOPY TIME:  0 minutes 8 seconds; 4 acquired images

[Series 1: dg x-ray · 0.20mm/px · 4 of 4 slices shown]
[im 1/4]
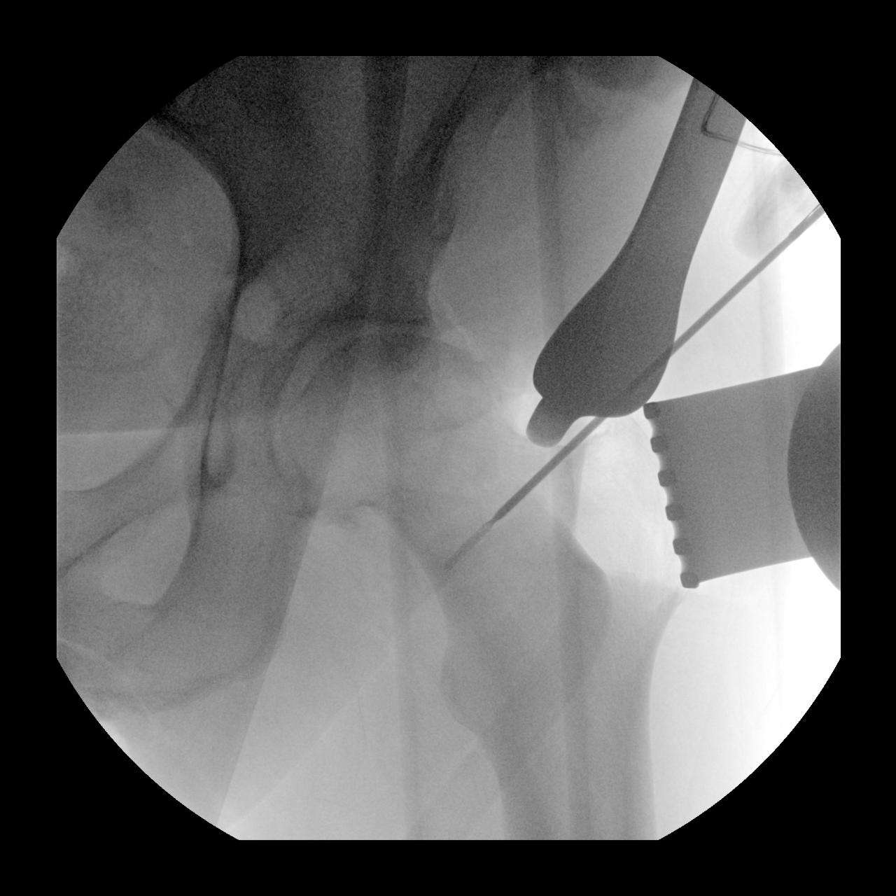
[im 2/4]
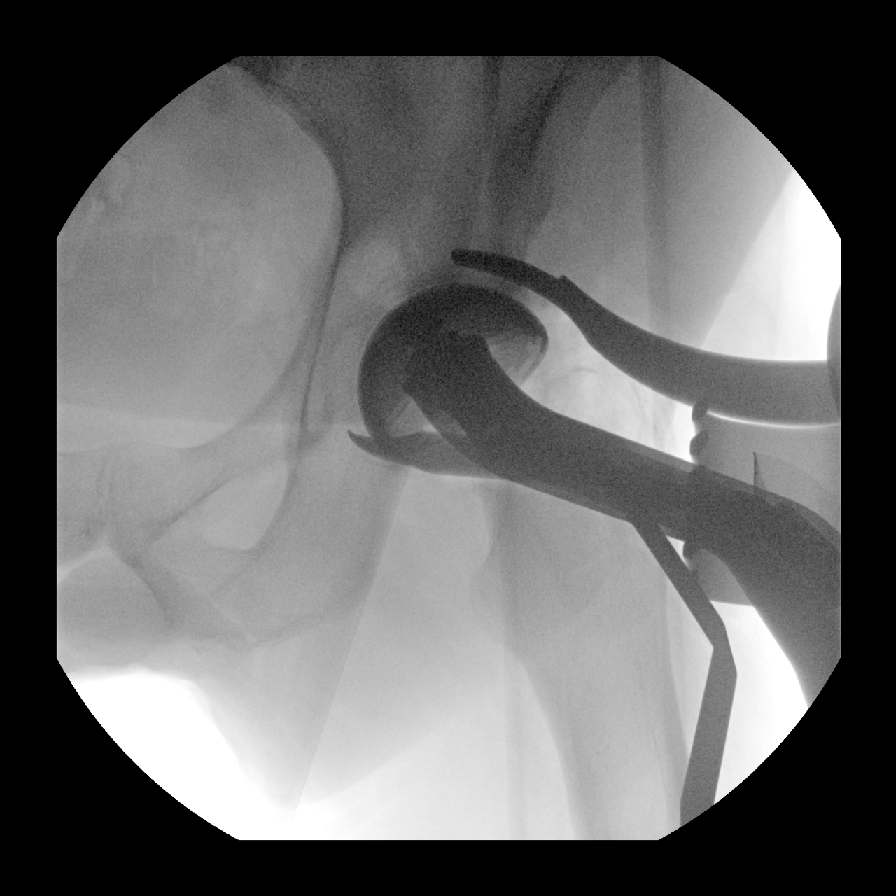
[im 3/4]
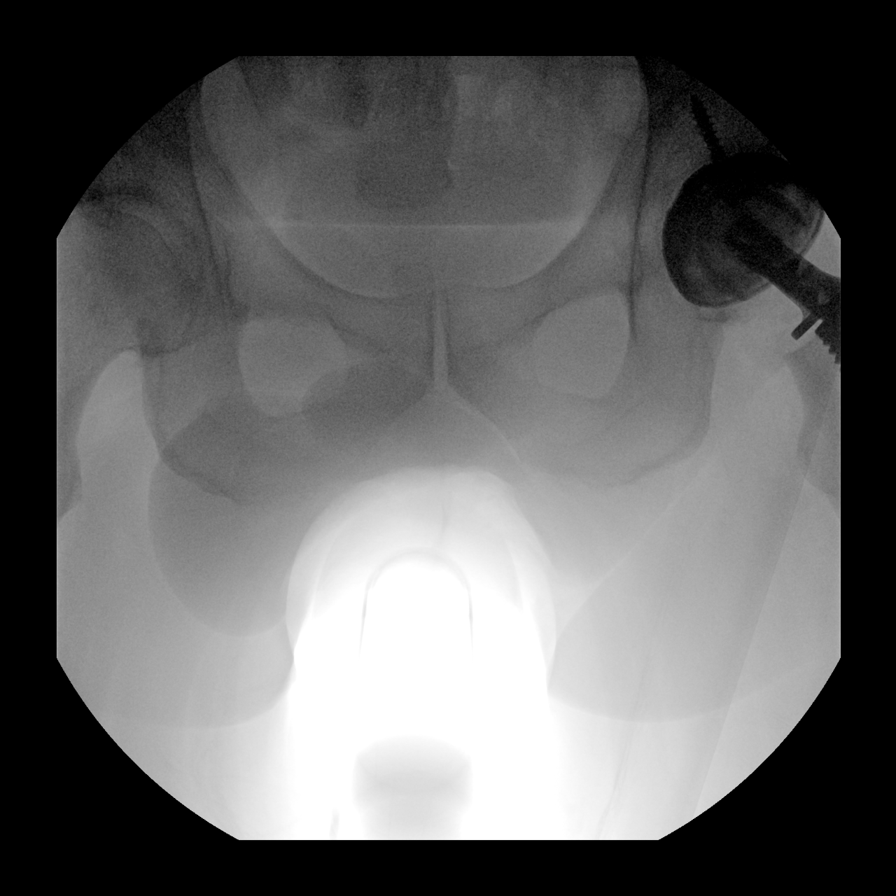
[im 4/4]
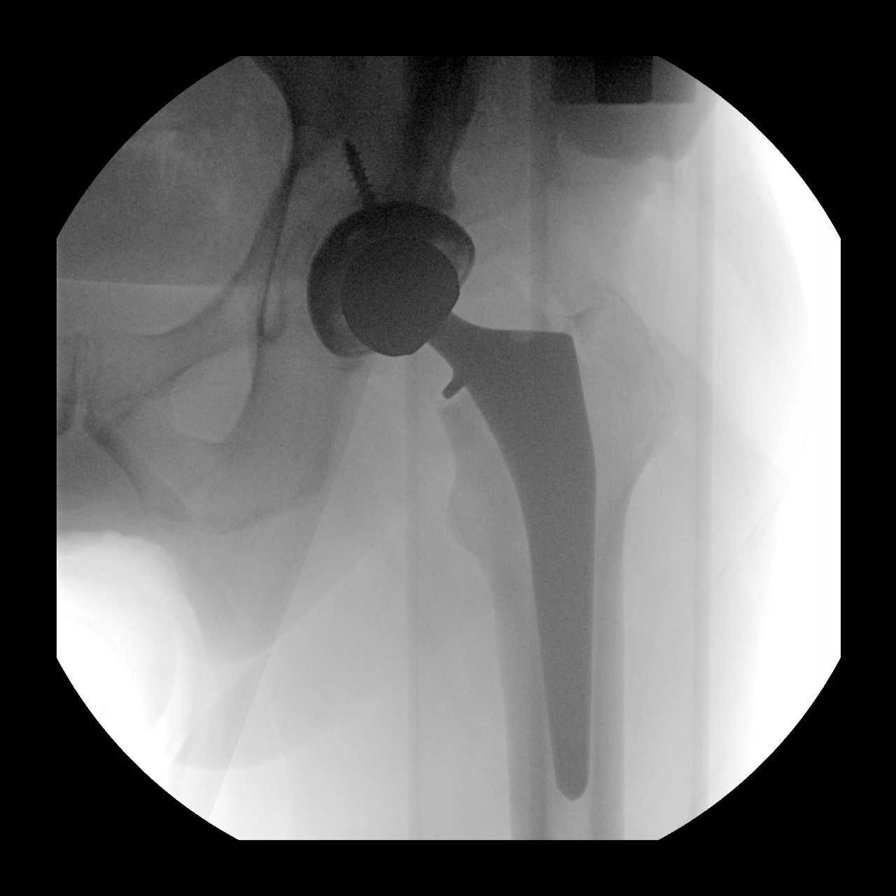

[4 of 4 positions shown; findings below may reference images not displayed]

FINDINGS: Frontal view shows evidence of total hip replacement on the left
with prosthetic components well-seated on frontal view. No fracture
or dislocation. Visualized right hip joint appears unremarkable.
IMPRESSION: Status post total hip replacement on the left with prosthetic
components well-seated on frontal view. Visualized right hip
unremarkable. No fracture or dislocation.

## 2024-02-10 ENCOUNTER — Ambulatory Visit: Admission: EM | Admit: 2024-02-10 | Discharge: 2024-02-10 | Disposition: A | Discharge status: Home / Self Care

## 2024-02-10 DIAGNOSIS — H66002 Acute suppurative otitis media without spontaneous rupture of ear drum, left ear: Secondary | ICD-10-CM | POA: Diagnosis not present

## 2024-02-10 DIAGNOSIS — J069 Acute upper respiratory infection, unspecified: Secondary | ICD-10-CM

## 2024-02-10 MED ORDER — IPRATROPIUM BROMIDE 0.06 % NA SOLN: 2.0000 | Freq: Four times a day (QID) | NASAL | 12 refills | Status: AC

## 2024-02-10 MED ORDER — AMOXICILLIN-POT CLAVULANATE 875-125 MG PO TABS: 1.0000 | ORAL_TABLET | Freq: Two times a day (BID) | ORAL | 0 refills | Status: AC

## 2024-02-10 MED ORDER — PROMETHAZINE-DM 6.25-15 MG/5ML PO SYRP: 5.0000 mL | ORAL_SOLUTION | Freq: Four times a day (QID) | ORAL | 0 refills | Status: AC | PRN

## 2024-02-10 MED ORDER — BENZONATATE 100 MG PO CAPS: 200.0000 mg | ORAL_CAPSULE | Freq: Three times a day (TID) | ORAL | 0 refills | Status: AC

## 2024-02-10 NOTE — ED Triage Notes (Signed)
 Sx 4 days  Left ear pain Left side nasal congestion Sinus pressure Upper back/neck pain  Cough that started today

## 2024-02-10 NOTE — ED Provider Notes (Signed)
 " [facility]    CSN: [id number] Arrival date & time: 02/10/24  1614      History   Chief Complaint Chief Complaint  Patient presents with   Otalgia    HPI AIMAN NOE is a 59 y.o. male.   HPI  59 year old male with past medical history significant for asthma, diabetes, GERD, hypertension, sleep apnea presents for evaluation of 4 days worth of left ear pain, left-sided nasal congestion, sinus pressure, upper back and neck pain, and a cough that started today.  His cough intermittently productive for a clear sputum.  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   Synovial cyst of right popliteal space 04/25/2019   Knee pain 08/30/2018   Localized, primary osteoarthritis 08/30/2018   Osteoarthritis of knee 08/30/2018    Past Surgical History:  Procedure Laterality Date   CHOLECYSTECTOMY     CHOLECYSTECTOMY, LAPAROSCOPIC  2015   COLONOSCOPY  01/2017   normal per pt   ESOPHAGOGASTRODUODENOSCOPY  2015   ESOPHAGOGASTRODUODENOSCOPY (EGD) WITH PROPOFOL  N/A 03/17/2019   Procedure: ESOPHAGOGASTRODUODENOSCOPY (EGD) WITH PROPOFOL ;  Surgeon: Therisa Bi, MD;  Location: [facility] ENDOSCOPY;  Service: Gastroenterology;  Laterality: N/A;   KNEE ARTHROSCOPY Right    x 2   TONSILLECTOMY     TOTAL HIP ARTHROPLASTY Left 05/29/2019   Procedure: TOTAL HIP ARTHROPLASTY ANTERIOR APPROACH;  Surgeon: Leora Lynwood SAUNDERS, MD;  Location: [facility] ORS;  Service: Orthopedics;  Laterality: Left;       Home Medications    Prior to Admission medications  Medication Sig Start Date End Date Taking? Authorizing Provider  amLODipine  (NORVASC ) 10 MG tablet Take 10 mg by mouth daily. 02/08/19   Yes [provider]  amoxicillin -clavulanate (AUGMENTIN ) 875-125 MG tablet Take 1 tablet by mouth every 12 (twelve) hours for 7 days. 02/10/24 02/17/24 Yes Bernardino Ditch, NP  atorvastatin  (LIPITOR) 20 MG tablet Take 20 mg by mouth daily. 02/08/19  Yes [provider]  benzonatate  (TESSALON ) 100 MG capsule Take 2 capsules (200 mg total) by mouth every 8 (eight) hours. 02/10/24  Yes Bernardino Ditch, NP  bisoprolol -hydrochlorothiazide  (ZIAC ) 5-6.25 MG tablet Take 1 tablet by mouth daily. 02/08/19  Yes [provider]  escitalopram (LEXAPRO) 5 MG tablet Take 5 mg by mouth daily. 11/26/23  Yes [provider]  glipiZIDE  (GLUCOTROL ) 5 MG tablet Take 5 mg by mouth daily. 04/13/19  Yes [provider]  ipratropium (ATROVENT ) 0.06 % nasal spray Place 2 sprays into both nostrils 4 (four) times daily. 02/10/24  Yes Bernardino Ditch, NP  lisinopril  (ZESTRIL ) 40 MG tablet Take 40 mg by mouth daily.  08/09/18  Yes [provider]  metFORMIN  (GLUCOPHAGE ) 1000 MG tablet Take 1,000 mg by mouth 2 (two) times daily with a meal.  08/09/18  Yes [provider]  OZEMPIC, 0.25 OR 0.5 MG/DOSE, 2 MG/3ML SOPN  02/06/24  Yes [provider]  promethazine -dextromethorphan (PROMETHAZINE -DM) 6.25-15 MG/5ML syrup Take 5 mLs by mouth 4 (four) times daily as needed. 02/10/24  Yes Bernardino Ditch, NP  triamterene-hydrochlorothiazide  (DYAZIDE) 37.5-25 MG capsule Take 1 capsule by mouth daily. 06/04/20  Yes [provider]  albuterol  (VENTOLIN   HFA) 108 (90 Base) MCG/ACT inhaler Inhale 2 puffs into the lungs every 6 (six) hours as needed for wheezing or shortness of breath.  01/08/19   [provider]  B-D UF III MINI PEN NEEDLES 31G X 5 MM MISC SMARTSIG:1 Each SUB-Q Daily 04/30/20   [provider]  Cholecalciferol (VITAMIN D-3) 25 MCG (1000 UT) CAPS Take 1,000 Units by mouth daily.     [provider]  docusate sodium  (COLACE) 100 MG capsule Take 1  capsule (100 mg total) by mouth 2 (two) times daily. 05/31/19   Joshua Lin, PA-C  Dulaglutide (TRULICITY) 0.75 MG/0.5ML SOAJ     [provider]  Lansoprazole (PREVACID PO) Take 1 capsule by mouth daily.    [provider]  methocarbamol  (ROBAXIN ) 500 MG tablet Take 500 mg by mouth 3 (three) times daily. 08/09/20   [provider]  NOVOLIN N FLEXPEN 100 UNIT/ML Kiwkpen Inject into the skin. 04/30/20   [provider]  ondansetron  (ZOFRAN ) 4 MG tablet Take 1 tablet (4 mg total) by mouth every 8 (eight) hours as needed. 11/13/20   Hyatt, Max T, DPM  sodium chloride  (OCEAN) 0.65 % SOLN nasal spray Place 1 spray into both nostrils as needed for congestion.    [provider]  terbinafine  (LAMISIL ) 250 MG tablet Take 1 tablet (250 mg total) by mouth daily. 10/02/20   Hyatt, Max T, DPM  traMADol (ULTRAM) 50 MG tablet Take 50 mg by mouth every 6 (six) hours as needed. 06/04/20   [provider]    Family History Family History  Problem Relation Age of Onset   Heart failure Mother    Diabetes Father    Heart failure Father    Hypertension Father    Multiple sclerosis Sister    Colon cancer Neg Hx     Social History Social History[1]   Allergies   Patient has no known allergies.   Review of Systems Review of Systems  Constitutional:  Negative for fever.  HENT:  Positive for congestion, ear pain and rhinorrhea. Negative for sore throat.   Respiratory:  Positive for cough. Negative for shortness of breath and wheezing.   Musculoskeletal:  Positive for back pain and neck pain.     Physical Exam Triage Vital Signs ED Triage Vitals  Encounter Vitals Group     BP --      Girls Systolic BP Percentile --      Girls Diastolic BP Percentile --      Boys Systolic BP Percentile --      Boys Diastolic BP Percentile --      Pulse --      Resp --      Temp --      Temp src --      SpO2 --      Weight 02/10/24 1647 250 lb (113.4 kg)      Height --      Head Circumference --      Peak Flow --      Pain Score 02/10/24 1646 1     Pain Loc --      Pain Education --      Exclude from Growth Chart --    No data found.  Updated Vital Signs BP (!) 149/89 (BP Location: Right Arm)   Pulse 67   Temp 98.8 F (37.1 C) (Oral)   Resp 17   Wt 250 lb (113.4 kg)   SpO2 94%   BMI 34.87 kg/m  Visual Acuity Right Eye Distance:   Left Eye Distance:   Bilateral Distance:    Right Eye Near:   Left Eye Near:    Bilateral Near:     Physical Exam Vitals and nursing note reviewed.  Constitutional:      Appearance: Normal appearance. He is not ill-appearing.  HENT:     Head: Normocephalic and atraumatic.     Right Ear: Tympanic membrane, ear canal and external ear normal. There is no impacted cerumen.     Left Ear: Ear canal and external ear normal. There is no impacted cerumen.     Ears:     Comments: Tympanic membrane is erythematous and there is a loss of landmarks.  Right TM is pearly gray in appearance.    Nose: Congestion and rhinorrhea present.     Comments: Nasal mucosa is mildly edematous and erythematous with light yellow discharge in both nares.    Mouth/Throat:     Mouth: Mucous membranes are moist.     Pharynx: Oropharynx is clear. No oropharyngeal exudate or posterior oropharyngeal erythema.  Cardiovascular:     Rate and Rhythm: Normal rate and regular rhythm.     Pulses: Normal pulses.     Heart sounds: Normal heart sounds. No murmur heard.    No friction rub. No gallop.  Pulmonary:     Effort: Pulmonary effort is normal.     Breath sounds: Normal breath sounds. No wheezing, rhonchi or rales.  Musculoskeletal:     Cervical back: Normal range of motion and neck supple. No tenderness.  Lymphadenopathy:     Cervical: No cervical adenopathy.  Skin:    General: Skin is warm and dry.     Capillary Refill: Capillary refill takes less than 2 seconds.     Findings: No erythema or rash.  Neurological:      General: No focal deficit present.     Mental Status: He is alert and oriented to person, place, and time.      UC Treatments / Results  Labs (all labs ordered are listed, but only abnormal results are displayed) Labs Reviewed - No data to display  EKG   Radiology No results found.  Procedures Procedures (including critical care time)  Medications Ordered in UC Medications - No data to display  Initial Impression / Assessment and Plan / UC Course  I have reviewed the triage vital signs and the nursing notes.  Pertinent labs & imaging results that were available during my care of the patient were reviewed by me and considered in my medical decision making (see chart for details).   Patient is a nontoxic-appearing 59 year old male presenting for evaluation of 4 days worth of respiratory symptoms as outlined in HPI above.  His most significant complaint is pain in his left ear.  He reports that his wife is getting over bronchitis and he has been in and out of the hospital and taking care of his dad who just had to have his leg amputated.  He came in today because he developed a cough this morning as well as upper back and neck pain which is typically a precursor to bronchitis and lower respiratory issues.  Currently, his lungs are clear to auscultation in all fields.  His upper respiratory exam does reveal an erythematous left tympanic membrane with loss of landmarks as well as inflammation of his nasal mucosa with yellow discharge in both nares.  I will treat the patient for otitis media and a URI with a  7-day course of Augmentin  as well as Atrovent  nasal spray for his nasal congestion and Tessalon  Perles and Promethazine  DM cough syrup for cough and congestion.  He does have an albuterol  inhaler but he has not had to use it.  He has been using his maintenance inhaler as prescribed.  Return precautions reviewed.   Final Clinical Impressions(s) / UC Diagnoses   Final diagnoses:   Non-recurrent acute suppurative otitis media of left ear without spontaneous rupture of tympanic membrane  URI with cough and congestion     Discharge Instructions      Take the Augmentin  twice daily with food for 7 days for treatment of your ear infection and URI.  Use over-the-counter Tylenol  and/or ibuprofen as needed for any fever or pain.  Continue to use your albuterol  inhaler as needed for any shortness breath or wheezing as well as your maintenance steroid inhaler.  Use the Atrovent  nasal spray, 2 squirts in each nostril every 6 hours, as needed for runny nose and postnasal drip.  Use the Tessalon  Perles every 8 hours during the day.  Take them with a small sip of water.  They may give you some numbness to the base of your tongue or a metallic taste in your mouth, this is normal.  Use the Promethazine  DM cough syrup at bedtime for cough and congestion.  It will make you drowsy so do not take it during the day.  Return for reevaluation or see your primary care provider for any new or worsening symptoms.      ED Prescriptions     Medication Sig Dispense Auth. Provider   amoxicillin -clavulanate (AUGMENTIN ) 875-125 MG tablet Take 1 tablet by mouth every 12 (twelve) hours for 7 days. 14 tablet Bernardino Ditch, NP   benzonatate  (TESSALON ) 100 MG capsule Take 2 capsules (200 mg total) by mouth every 8 (eight) hours. 21 capsule Bernardino Ditch, NP   ipratropium (ATROVENT ) 0.06 % nasal spray Place 2 sprays into both nostrils 4 (four) times daily. 15 mL Bernardino Ditch, NP   promethazine -dextromethorphan (PROMETHAZINE -DM) 6.25-15 MG/5ML syrup Take 5 mLs by mouth 4 (four) times daily as needed. 118 mL Bernardino Ditch, NP      PDMP not reviewed this encounter.    [1]  Social History Tobacco Use   Smoking status: Never   Smokeless tobacco: Current    Types: Snuff  Vaping Use   Vaping status: Never Used  Substance Use Topics   Alcohol use: Yes    Comment: about 2 drinks per month    Drug use: Never     Bernardino Ditch, NP 02/10/24 1702  "

## 2024-02-10 NOTE — Discharge Instructions (Addendum)
 Take the Augmentin  twice daily with food for 7 days for treatment of your ear infection and URI.  Use over-the-counter Tylenol  and/or ibuprofen as needed for any fever or pain.  Continue to use your albuterol  inhaler as needed for any shortness breath or wheezing as well as your maintenance steroid inhaler.  Use the Atrovent  nasal spray, 2 squirts in each nostril every 6 hours, as needed for runny nose and postnasal drip.  Use the Tessalon  Perles every 8 hours during the day.  Take them with a small sip of water.  They may give you some numbness to the base of your tongue or a metallic taste in your mouth, this is normal.  Use the Promethazine  DM cough syrup at bedtime for cough and congestion.  It will make you drowsy so do not take it during the day.  Return for reevaluation or see your primary care provider for any new or worsening symptoms.
